# Patient Record
Sex: Male | Born: 1970 | Race: Asian | Hispanic: No | Marital: Married | Smoking: Never smoker
Health system: Southern US, Community
[De-identification: ages and names within clinical notes are randomized; demographics above are authoritative.]

---

## 2016-03-17 ENCOUNTER — Ambulatory Visit (INDEPENDENT_AMBULATORY_CARE_PROVIDER_SITE_OTHER): Payer: BLUE CROSS/BLUE SHIELD

## 2016-03-17 ENCOUNTER — Ambulatory Visit (HOSPITAL_COMMUNITY)
Admission: EM | Admit: 2016-03-17 | Discharge: 2016-03-17 | Disposition: A | Discharge status: Home / Self Care | Payer: BLUE CROSS/BLUE SHIELD | Attending: Family Medicine | Admitting: Family Medicine

## 2016-03-17 ENCOUNTER — Encounter (HOSPITAL_COMMUNITY): Payer: Self-pay | Admitting: Emergency Medicine

## 2016-03-17 DIAGNOSIS — S0512XA Contusion of eyeball and orbital tissues, left eye, initial encounter: Secondary | ICD-10-CM | POA: Diagnosis not present

## 2016-03-17 DIAGNOSIS — S01412A Laceration without foreign body of left cheek and temporomandibular area, initial encounter: Secondary | ICD-10-CM

## 2016-03-17 NOTE — ED Provider Notes (Signed)
CSN: [id number]     Arrival date & time 03/17/16  1533 History   First MD Initiated Contact with Patient 03/17/16 1707     Chief Complaint  Patient presents with  . Head Injury   (Consider location/radiation/quality/duration/timing/severity/associated sxs/prior Treatment) HPI Comments: [age] year old Asian male was playing baseball with his son when his son threw a baseball and struck him in the left eye. This occurred late this morning. He developed left periorbital edema and there is a superficial epidermal laceration across the left infraorbital space. He states there was no loss of consciousness. No time of confusion, disorientation or problems with memory. No problems with vision. No  diplopia or blurring of vision. 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  He states he went to a urgent care on [address] earlier today and was told he may need to have x-rays to be sure that there are no fractures. He was sent to this urgent care.  He is fully awake, alert, smiling, laughing, jovial and stating that he has minor eye discomfort. Denies visual changes at all. States his vision is clear.    History reviewed. No pertinent past medical history. History reviewed. No pertinent past surgical history. No family history on file. Social History  Substance Use Topics  . Smoking status: Never Smoker   . Smokeless tobacco: None  . Alcohol Use: Yes    Review of Systems  Constitutional: Negative for fever, activity change and fatigue.  HENT: Positive for facial swelling. Negative for congestion, dental problem, ear discharge, ear pain, postnasal drip and sore throat.   Eyes: Negative for photophobia, pain, discharge, redness, itching and visual disturbance.  Respiratory: Negative.   Musculoskeletal: Negative.   Skin: Positive for wound.  Neurological: Negative for dizziness, tremors, seizures, syncope, speech difficulty, weakness, light-headedness, numbness and headaches.   Psychiatric/Behavioral: Negative.   All other systems reviewed and are negative.   Allergies  Review of patient's allergies indicates no known allergies.  Home Medications   Prior to Admission medications   Not on File   Meds Ordered and Administered this Visit  Medications - No data to display  BP 124/83 mmHg  Pulse 72  Temp(Src) 97.7 F (36.5 C) (Oral)  Resp 16  SpO2 100% No data found.   Physical Exam  Constitutional: He is oriented to person, place, and time. He appears well-developed and well-nourished. No distress.  HENT:  Head: Normocephalic.  Bilateral TMs are normal. No hemotympanum. Oropharynx is clear. Mandible with full range of motion. No tenderness to the TMJ, maxilla or mandible. There is mild tenderness to the supra and infra orbital bones. No mobility of the bones. There is periorbital swelling and ecchymosis.  Eyes: Conjunctivae and EOM are normal. Pupils are equal, round, and reactive to light. Right eye exhibits no discharge. Left eye exhibits no discharge.  Sclera clear. No erythema or hemorrhage. Anterior chamber clear. No hyphema. Normal pupillary reaction. Full and intact EOM. No evidence of entrapment.  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   Neck: Normal range of motion. Neck supple.  Cardiovascular: Normal rate.   Pulmonary/Chest: Effort normal. No respiratory distress.  Neurological: He is alert and oriented to person, place, and time. He has normal strength. He displays no tremor. No cranial nerve deficit or sensory deficit. He exhibits normal muscle tone. Coordination and gait normal. GCS eye subscore is 4. GCS verbal subscore is 5. GCS  motor subscore is 6.  Skin: Skin is warm and dry.  Psychiatric: He has a normal mood and affect. His behavior is normal. Judgment and thought content normal.  Nursing note and vitals reviewed.   ED Course  .Marland KitchenLaceration  Repair Date/Time: 03/17/2016 5:35 PM Performed by: Phineas Real Ariann Khaimov Authorized by: Bradd Canary D Consent: Verbal consent obtained. Risks and benefits: risks, benefits and alternatives were discussed Consent given by: patient Patient understanding: patient states understanding of the procedure being performed Patient identity confirmed: verbally with patient Body area: head/neck Location details: left cheek Laceration length: 3 cm Foreign bodies: no foreign bodies Tendon involvement: none Nerve involvement: none Vascular damage: no Patient sedated: no Irrigation solution: saline Irrigation method: tap Amount of cleaning: standard Debridement: none Degree of undermining: none Skin closure: glue Approximation: close Approximation difficulty: simple Patient tolerance: Patient tolerated the procedure well with no immediate complications Comments: The laceration did not involve the full thickness of the dermis. Edges well marginated with minimal separation.    (including critical care time)  Labs Review Labs Reviewed - No data to display  Imaging Review Dg Orbits  03/17/2016  CLINICAL DATA:  Hit in left eye with baseball today. EXAM: ORBITS - COMPLETE 4+ VIEW COMPARISON:  None. FINDINGS: There is no evidence of fracture or other significant bone abnormality. No orbital emphysema or sinus air-fluid levels are seen. IMPRESSION: Normal orbits. Electronically Signed   By: Lupita Raider, M.D.   On: 03/17/2016 18:03     Visual Acuity Review  Right Eye Distance: 20/20 (with corrective lens) Left Eye Distance: 20/25 (corrective lens) Bilateral Distance: 20/15 (with corrective lens)  Right Eye Near:   Left Eye Near:    Bilateral Near:         MDM   1. Contusion of orbit, left, initial encounter   2. Laceration of cheek without complication, left, initial encounter    The x-ray is negative. The superficial laceration beneath the eye over the upper cheek was closed with  Dermabond. Instructions for laceration care as well has red flags and symptoms and signs to watch for that would require medical attention promptly are given. For now continue to place ice over the eye for swelling. For any new symptoms, problems or worsening particularly with vision of the eye, headaches, nausea, vomiting, unusual sleepiness, confusion, problems with memory, focal weakness or other problems seeing medical attention promptly. The patient is fully alert and awake showing no signs of distress or head injury. He is discharged home in good condition.     Hayden Rasmussen, NP 03/17/16 [phone]

## 2016-03-17 NOTE — Discharge Instructions (Signed)
Laceration Care, Adult A laceration is a cut that goes through all layers of the skin. The cut also goes into the tissue that is right under the skin. Some cuts heal on their own. Others need to be closed with stitches (sutures), staples, skin adhesive strips, or wound glue. Taking care of your cut lowers your risk of infection and helps your cut to heal better. HOW TO TAKE CARE OF YOUR CUT For stitches or staples:  Keep the wound clean and dry.  If you were given a bandage (dressing), you should change it at least one time per day or as told by your doctor. You should also change it if it gets wet or dirty.  Keep the wound completely dry for the first 24 hours or as told by your doctor. After that time, you may take a shower or a bath. However, make sure that the wound is not soaked in water until after the stitches or staples have been removed.  Clean the wound one time each day or as told by your doctor:  Wash the wound with soap and water.  Rinse the wound with water until all of the soap comes off.  Pat the wound dry with a clean towel. Do not rub the wound.  After you clean the wound, put a thin layer of antibiotic ointment on it as told by your doctor. This ointment:  Helps to prevent infection.  Keeps the bandage from sticking to the wound.  Have your stitches or staples removed as told by your doctor. If your doctor used skin adhesive strips:   Keep the wound clean and dry.  If you were given a bandage, you should change it at least one time per day or as told by your doctor. You should also change it if it gets dirty or wet.  Do not get the skin adhesive strips wet. You can take a shower or a bath, but be careful to keep the wound dry.  If the wound gets wet, pat it dry with a clean towel. Do not rub the wound.  Skin adhesive strips fall off on their own. You can trim the strips as the wound heals. Do not remove any strips that are still stuck to the wound. They will  fall off after a while. If your doctor used wound glue:++++++++++++++  Try to keep your wound dry, but you may briefly wet it in the shower or bath. Do not soak the wound in water, such as by swimming.  After you take a shower or a bath, gently pat the wound dry with a clean towel. Do not rub the wound.  Do not do any activities that will make you really sweaty until the skin glue has fallen off on its own.  Do not apply liquid, cream, or ointment medicine to your wound while the skin glue is still on.  If you were given a bandage, you should change it at least one time per day or as told by your doctor. You should also change it if it gets dirty or wet.  If a bandage is placed over the wound, do not let the tape for the bandage touch the skin glue.  Do not pick at the glue. The skin glue usually stays on for 5-10 days. Then, it falls off of the skin. General Instructions  To help prevent scarring, make sure to cover your wound with sunscreen whenever you are outside after stitches are removed, after adhesive strips are removed,  or when wound glue stays in place and the wound is healed. Make sure to wear a sunscreen of at least 30 SPF.  Take over-the-counter and prescription medicines only as told by your doctor.  If you were given antibiotic medicine or ointment, take or apply it as told by your doctor. Do not stop using the antibiotic even if your wound is getting better.  Do not scratch or pick at the wound.  Keep all follow-up visits as told by your doctor. This is important.  Check your wound every day for signs of infection. Watch for:  Redness, swelling, or pain.  Fluid, blood, or pus.  Raise (elevate) the injured area above the level of your heart while you are sitting or lying down, if possible. GET HELP IF:  You got a tetanus shot and you have any of these problems at the injection site:  Swelling.  Very bad pain.  Redness.  Bleeding.  You have a fever.  A  wound that was closed breaks open.  You notice a bad smell coming from your wound or your bandage.  You notice something coming out of the wound, such as wood or glass.  Medicine does not help your pain.  You have more redness, swelling, or pain at the site of your wound.  You have fluid, blood, or pus coming from your wound.  You notice a change in the color of your skin near your wound.  You need to change the bandage often because fluid, blood, or pus is coming from the wound.  You start to have a new rash.  You start to have numbness around the wound. GET HELP RIGHT AWAY IF:  You have very bad swelling around the wound.  Your pain suddenly gets worse and is very bad.  You notice painful lumps near the wound or on skin that is anywhere on your body.  You have a red streak going away from your wound.  The wound is on your hand or foot and you cannot move a finger or toe like you usually can.  The wound is on your hand or foot and you notice that your fingers or toes look pale or bluish.   This information is not intended to replace advice given to you by your health care provider. Make sure you discuss any questions you have with your health care provider.   Document Released: 05/10/2008 Document Revised: 04/08/2015 Document Reviewed: 11/18/2014 Elsevier Interactive Patient Education 2016 [employer].  Hyphema.   You do not have one but should you develop this need to seek medical treatment. Hyphema is bleeding in the eye. This may occurin the front of the eye between the clear covering of the eye (cornea) and the colored part of the eye (iris). You may be able to see the blood in the front part of your eye. A hyphema may be large or small. Hyphema may be painful and can affect your vision. Treatment is important to prevent permanent loss of vision. CAUSES  Eye injury, such as a blow to your eye or upper part of your face, is the most common cause of this condition. Eye  surgery can also cause this condition. Other less common causes include:  Abnormal blood vessels that form in the iris.  Eye infections.  Blood clotting disorders.  Artificial lenses used after cataract surgery.  Eye cancer. RISK FACTORS This condition is more likely to occur in people who play sports, especially sports that use small balls. You  may also be more likely to develop this condition if you:  Have a disease that prevents normal blood clotting, such as hemophilia.  Take certain medicines that thin your blood, such as aspirin.  Have diabetes.  Had recent eye surgery.  Have sickle cell anemia. SYMPTOMS  The most common symptom of this condition is a pool of blood in the front of your eye. The blood may appear red or black. A very small hyphema may not be visible. A large hyphema may fill part or all of the front part of your eye. Symptoms may also include:   Blurred vision or vision loss.  Pain.  Sensitivity to bright light. DIAGNOSIS  This condition is diagnosed with a medical history and physical exam. You may have a blood test to check for a bleeding disorder or sickle cell disease. You may also have an eye exam done by an eye specialist (ophthalmologist). This may include:   Checking your eye with a type of microscope (slit lamp).  A vision test.  Measuring the pressure in your eye. TREATMENT  Treatment depends on the severity of the condition. Many hyphemas go away on their own. Your health care provider will monitor your hyphema closely until it goes away completely. Treatment may also include:   Restricted activity or bed rest with your head elevated.  Wearing a cover over your eye (eye shield) to protect it from further injury.  Stopping all medicines that can increase bleeding, such as aspirin. Only do this as told by your health care provider.  Eye drops or medicines taken by mouth to control swelling and pressure in your eye. Eye surgery may be  needed to remove the hyphema if other treatments do not help. HOME CARE INSTRUCTIONS   Rest in bed as told by your health care provider. Lie on your back and use extra pillows to keep your head raised.  Take over-the-counter and prescription medicines only as told by your health care provider.  Wear your eye shield as told by your health care provider.  Do not bend forward or lower your head until your health care provider approves.  Do not lift anything that is heavier than 10 lb (4.5 kg) until your health care provider approves.  Keep all follow-up visits as told by your health care provider. This is important. PREVENTION  It is important to always wear eye protection when you are doing any activity that can result in eye injury. SEEK MEDICAL CARE IF:   You develop pain in the affected eye.  Your vision is not improving.  The amount of blood in your eye does not decrease after several days. SEEK IMMEDIATE MEDICAL CARE IF:   Your vision gets worse.  The amount of blood in your eye increases.  You feel nauseous or vomit.   This information is not intended to replace advice given to you by your health care provider. Make sure you discuss any questions you have with your health care provider.   Document Released: 02/28/2001 Document Revised: 08/13/2015 Document Reviewed: 04/16/2015 Elsevier Interactive Patient Education 2016 [employer].  Stitches, [place], or Adhesive Wound Closure Health care providers use stitches (sutures), staples, and certain glue (skin adhesives) to hold skin together while it heals (wound closure). You may need this treatment after you have surgery or if you cut your skin accidentally. These methods help your skin to heal more quickly and make it less likely that you will have a scar. A wound may take several months  to heal completely. The type of wound you have determines when your wound gets closed. In most cases, the wound is closed as soon as  possible (primary skin closure). Sometimes, closure is delayed so the wound can be cleaned and allowed to heal naturally. This reduces the chance of infection. Delayed closure may be needed if your wound:  Is caused by a bite.  Happened more than 6 hours ago.  Involves loss of skin or the tissues under the skin.  Has dirt or debris in it that cannot be removed.  Is infected. WHAT ARE THE DIFFERENT KINDS OF WOUND CLOSURES? There are many options for wound closure. The one that your health care provider uses depends on how deep and how large your wound is. Adhesive Glue To use this type of glue to close a wound, your health care provider holds the edges of the wound together and paints the glue on the surface of your skin. You may need more than one layer of glue. Then the wound may be covered with a light bandage (dressing). This type of skin closure may be used for small wounds that are not deep (superficial). Using glue for wound closure is less painful than other methods. It does not require a medicine that numbs the area (local anesthetic). This method also leaves nothing to be removed. Adhesive glue is often used for children and on facial wounds. Adhesive glue cannot be used for wounds that are deep, uneven, or bleeding. It is not used inside of a wound.  Adhesive Strips These strips are made of sticky (adhesive), porous paper. They are applied across your skin edges like a regular adhesive bandage. You leave them on until they fall off. Adhesive strips may be used to close very superficial wounds. They may also be used along with sutures to improve the closure of your skin edges.  Sutures Sutures are the oldest method of wound closure. Sutures can be made from natural substances, such as silk, or from synthetic materials, such as nylon and steel. They can be made from a material that your body can break down as your wound heals (absorbable), or they can be made from a material that needs  to be removed from your skin (nonabsorbable). They come in many different strengths and sizes. Your health care provider attaches the sutures to a steel needle on one end. Sutures can be passed through your skin, or through the tissues beneath your skin. Then they are tied and cut. Your skin edges may be closed in one continuous stitch or in separate stitches. Sutures are strong and can be used for all kinds of wounds. Absorbable sutures may be used to close tissues under the skin. The disadvantage of sutures is that they may cause skin reactions that lead to infection. Nonabsorbable sutures need to be removed. Staples When surgical staples are used to close a wound, the edges of your skin on both sides of the wound are brought close together. A staple is placed across the wound, and an instrument secures the edges together. Staples are often used to close surgical cuts (incisions). Staples are faster to use than sutures, and they cause less skin reaction. Staples need to be removed using a tool that bends the staples away from your skin. HOW DO I CARE FOR MY WOUND CLOSURE?  Take medicines only as directed by your health care provider.  If you were prescribed an antibiotic medicine for your wound, finish it all even if you start to  feel better.  Use ointments or creams only as directed by your health care provider.  Wash your hands with soap and water before and after touching your wound.  Do not soak your wound in water. Do not take baths, swim, or use a hot tub until your health care provider approves.  Ask your health care provider when you can start showering. Cover your wound if directed by your health care provider.  Do not take out your own sutures or staples.  Do not pick at your wound. Picking can cause an infection.  Keep all follow-up visits as directed by your health care provider. This is important. HOW LONG WILL I HAVE MY WOUND CLOSURE?  Leave adhesive glue on your skin until  the glue peels away.  Leave adhesive strips on your skin until the strips fall off.  Absorbable sutures will dissolve within several days.  Nonabsorbable sutures and staples must be removed. The location of the wound will determine how long they stay in. This can range from several days to a couple of weeks. WHEN SHOULD I SEEK HELP FOR MY WOUND CLOSURE? Contact your health care provider if:  You have a fever.  You have chills.  You have drainage, redness, swelling, or pain at your wound.  There is a bad smell coming from your wound.  The skin edges of your wound start to separate after your sutures have been removed.  Your wound becomes thick, raised, and darker in color after your sutures come out (scarring).   This information is not intended to replace advice given to you by your health care provider. Make sure you discuss any questions you have with your health care provider.   Document Released: 08/17/2001 Document Revised: 12/13/2014 Document Reviewed: 05/01/2014 Elsevier Interactive Patient Education [employer]

## 2016-03-17 NOTE — ED Notes (Signed)
Hit in left eye with a baseball, swelling, bruising present.

## 2017-04-14 IMAGING — DX DG ORBITS COMPLETE 4+V
3 series · 3 of 3 positions shown · non-contrast
Comparison: None.

CLINICAL DATA: Hit in left eye with baseball today.

EXAM:
ORBITS - COMPLETE 4+ VIEW

[orbital axial]
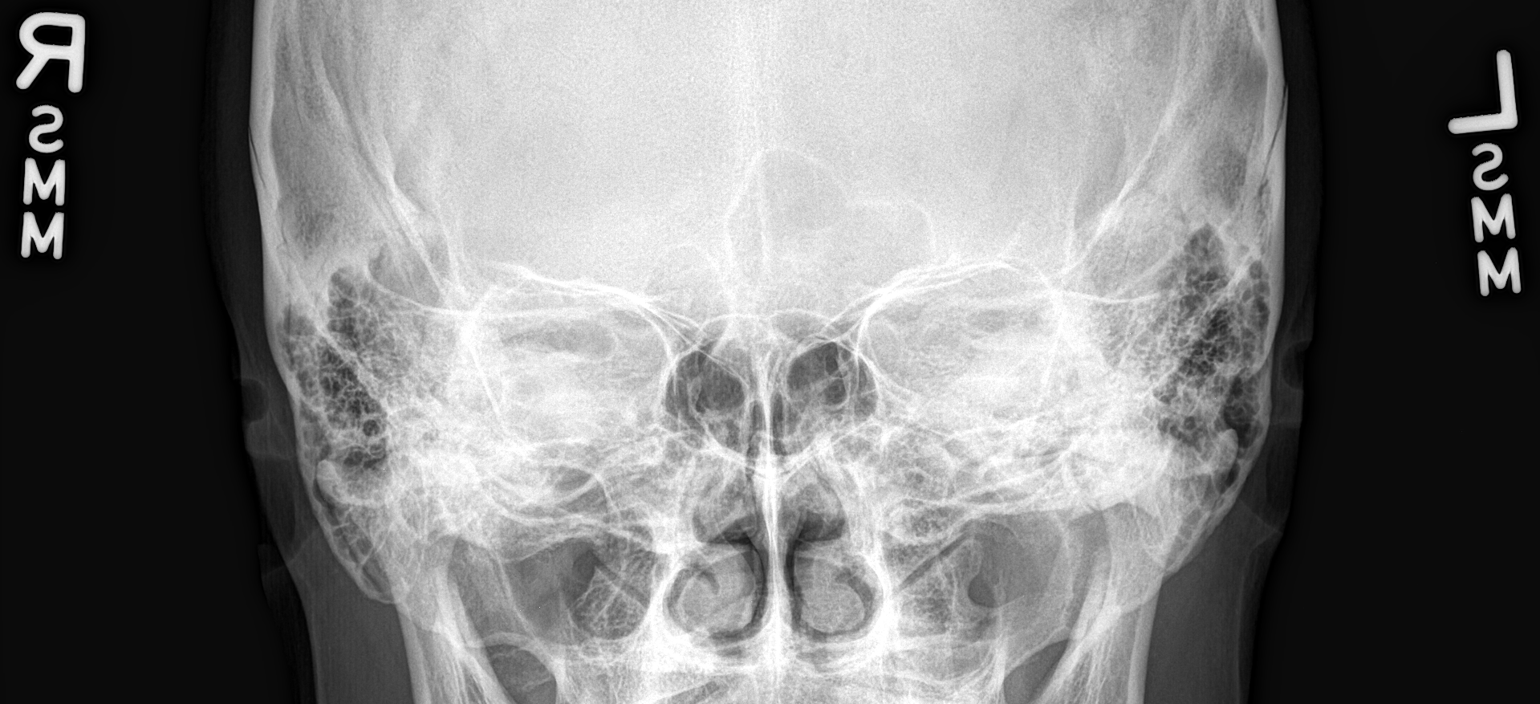

[skull waters]
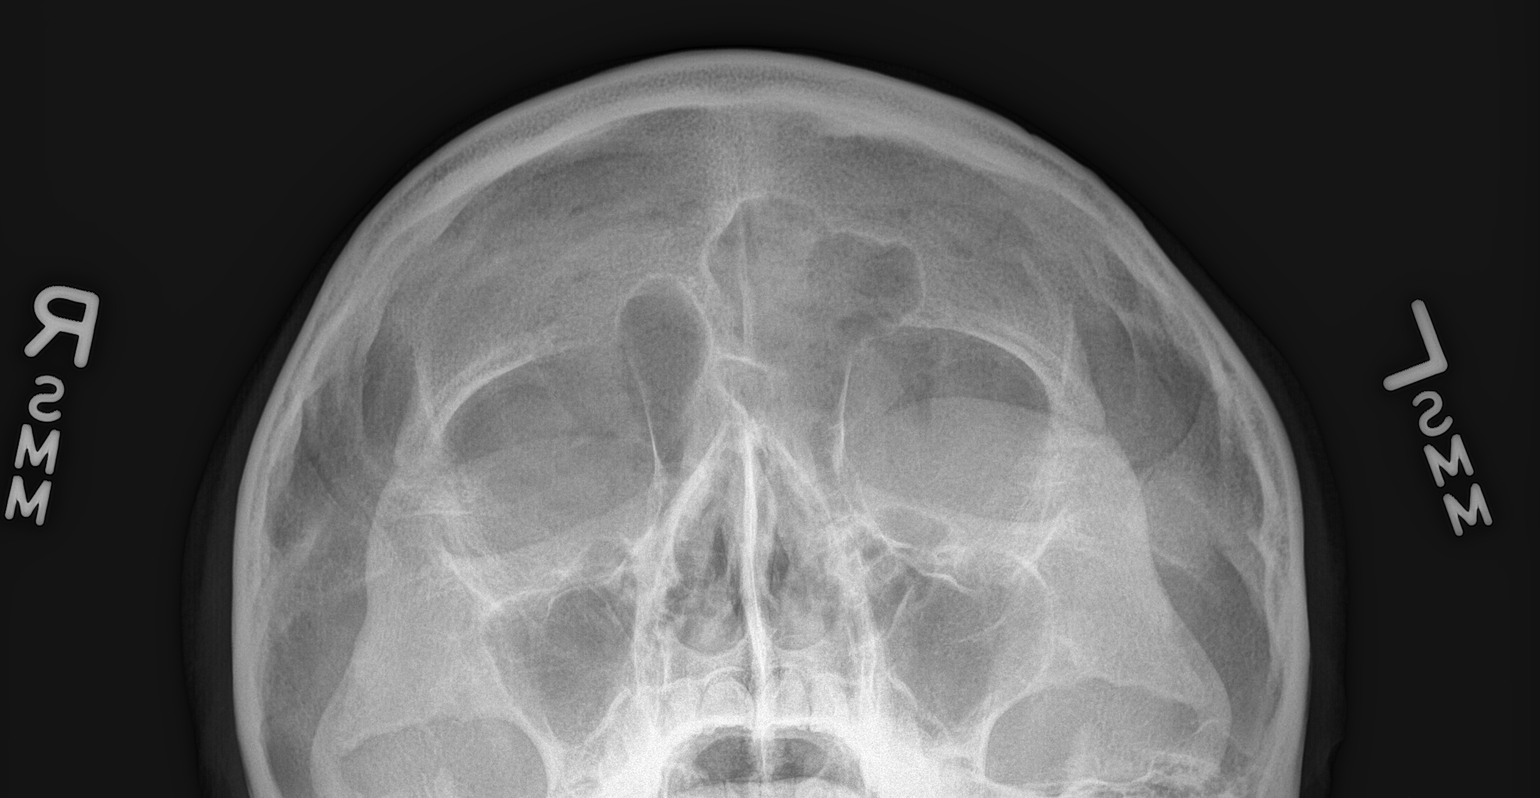

[skull lat]
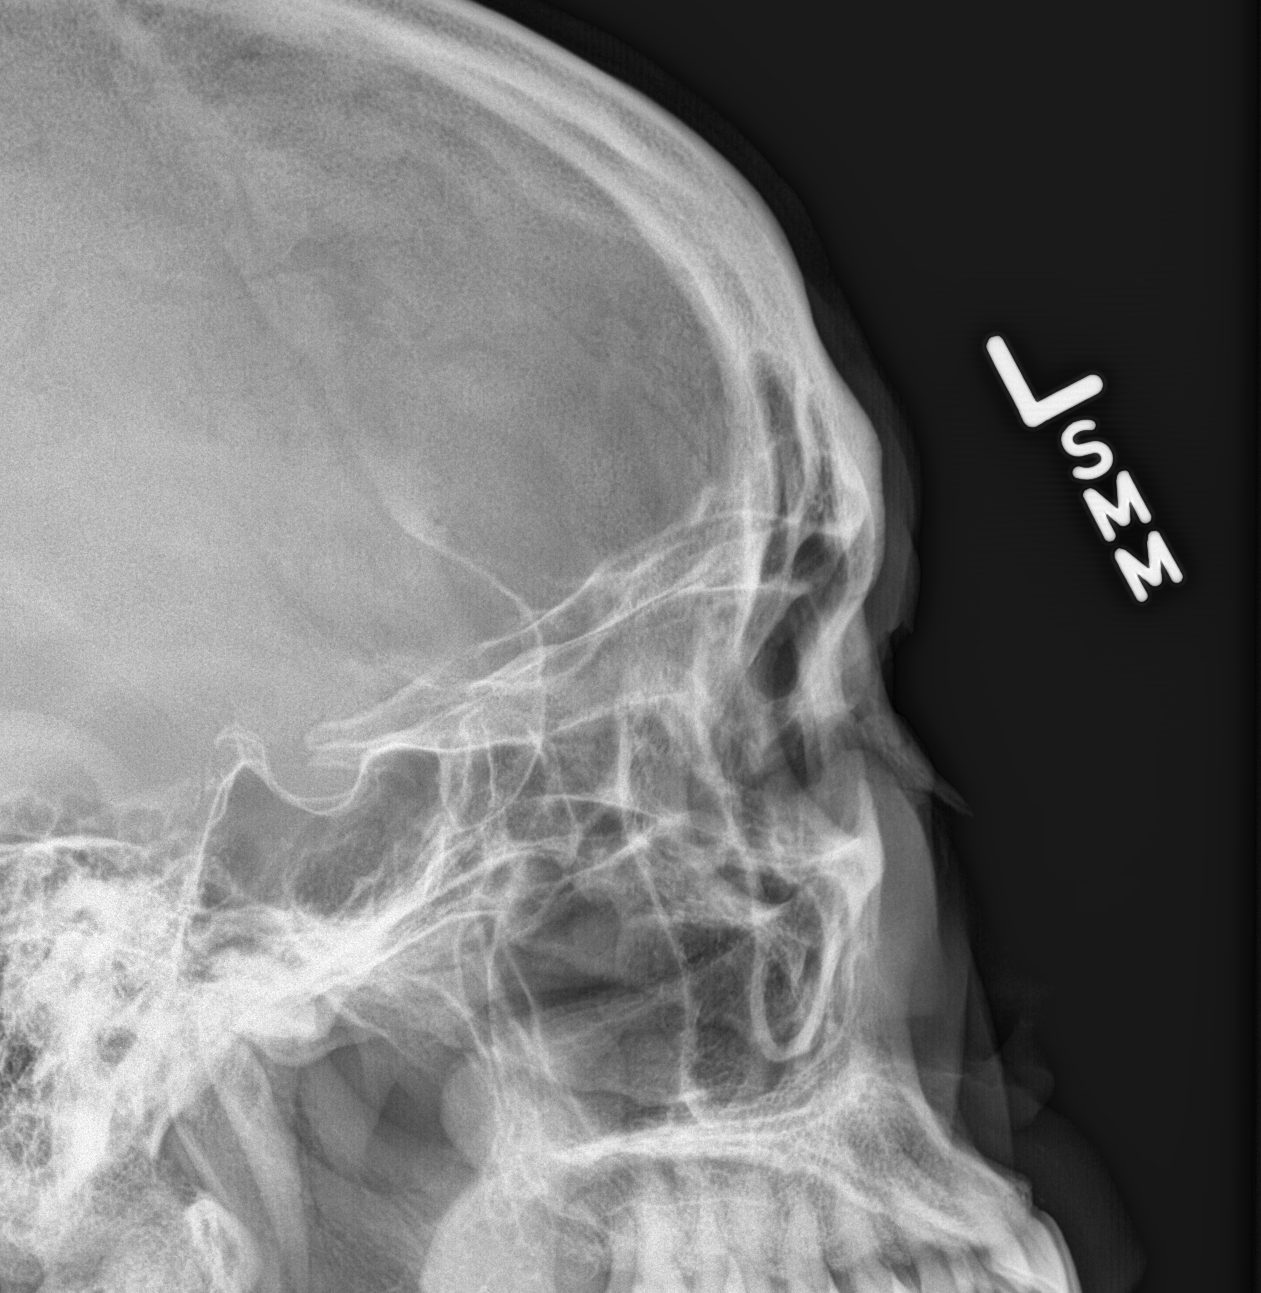

[3 of 3 positions shown; findings below may reference images not displayed]

FINDINGS: There is no evidence of fracture or other significant bone
abnormality. No orbital emphysema or sinus air-fluid levels are
seen.
IMPRESSION: Normal orbits.

## 2018-01-11 DIAGNOSIS — Z0000 Encounter for general adult medical examination without abnormal findings: Secondary | ICD-10-CM | POA: Diagnosis not present

## 2018-01-11 DIAGNOSIS — Z13220 Encounter for screening for lipoid disorders: Secondary | ICD-10-CM | POA: Diagnosis not present

## 2019-02-02 DIAGNOSIS — Z13220 Encounter for screening for lipoid disorders: Secondary | ICD-10-CM | POA: Diagnosis not present

## 2019-02-02 DIAGNOSIS — Z0000 Encounter for general adult medical examination without abnormal findings: Secondary | ICD-10-CM | POA: Diagnosis not present

## 2019-02-02 DIAGNOSIS — Z8249 Family history of ischemic heart disease and other diseases of the circulatory system: Secondary | ICD-10-CM | POA: Diagnosis not present

## 2020-07-20 ENCOUNTER — Emergency Department (HOSPITAL_COMMUNITY): Payer: No Typology Code available for payment source

## 2020-07-20 ENCOUNTER — Encounter (HOSPITAL_COMMUNITY)
Admission: EM | Disposition: A | Discharge status: Home / Self Care | Payer: Self-pay | Source: Home / Self Care | Attending: Orthopedic Surgery

## 2020-07-20 ENCOUNTER — Encounter (HOSPITAL_COMMUNITY): Payer: Self-pay | Admitting: Anesthesiology

## 2020-07-20 ENCOUNTER — Emergency Department (HOSPITAL_COMMUNITY): Payer: No Typology Code available for payment source | Admitting: Certified Registered Nurse Anesthetist

## 2020-07-20 ENCOUNTER — Observation Stay (HOSPITAL_COMMUNITY)
Admission: EM | Admit: 2020-07-20 | Discharge: 2020-07-21 | Disposition: A | Discharge status: Home / Self Care | Payer: No Typology Code available for payment source | Attending: Orthopedic Surgery | Admitting: Orthopedic Surgery

## 2020-07-20 DIAGNOSIS — Y999 Unspecified external cause status: Secondary | ICD-10-CM | POA: Diagnosis not present

## 2020-07-20 DIAGNOSIS — Y929 Unspecified place or not applicable: Secondary | ICD-10-CM | POA: Diagnosis not present

## 2020-07-20 DIAGNOSIS — S82842B Displaced bimalleolar fracture of left lower leg, initial encounter for open fracture type I or II: Secondary | ICD-10-CM | POA: Diagnosis not present

## 2020-07-20 DIAGNOSIS — Y939 Activity, unspecified: Secondary | ICD-10-CM | POA: Insufficient documentation

## 2020-07-20 DIAGNOSIS — S99912A Unspecified injury of left ankle, initial encounter: Secondary | ICD-10-CM | POA: Diagnosis present

## 2020-07-20 DIAGNOSIS — Z23 Encounter for immunization: Secondary | ICD-10-CM | POA: Diagnosis not present

## 2020-07-20 DIAGNOSIS — U071 COVID-19: Secondary | ICD-10-CM | POA: Diagnosis not present

## 2020-07-20 DIAGNOSIS — R52 Pain, unspecified: Secondary | ICD-10-CM

## 2020-07-20 DIAGNOSIS — S9304XA Dislocation of right ankle joint, initial encounter: Secondary | ICD-10-CM

## 2020-07-20 DIAGNOSIS — S82842C Displaced bimalleolar fracture of left lower leg, initial encounter for open fracture type IIIA, IIIB, or IIIC: Secondary | ICD-10-CM | POA: Diagnosis present

## 2020-07-20 DIAGNOSIS — S82899A Other fracture of unspecified lower leg, initial encounter for closed fracture: Secondary | ICD-10-CM | POA: Diagnosis present

## 2020-07-20 DIAGNOSIS — S82891B Other fracture of right lower leg, initial encounter for open fracture type I or II: Secondary | ICD-10-CM

## 2020-07-20 DIAGNOSIS — T148XXA Other injury of unspecified body region, initial encounter: Secondary | ICD-10-CM

## 2020-07-20 HISTORY — PX: ORIF ANKLE FRACTURE: SHX5408

## 2020-07-20 LAB — CBC WITH DIFFERENTIAL/PLATELET
Abs Immature Granulocytes: 0.03 10*3/uL (ref 0.00–0.07)
Basophils Absolute: 0.1 10*3/uL (ref 0.0–0.1)
Basophils Relative: 1 %
Eosinophils Absolute: 0.4 10*3/uL (ref 0.0–0.5)
Eosinophils Relative: 5 %
HCT: 42 % (ref 39.0–52.0)
Hemoglobin: 13.9 g/dL (ref 13.0–17.0)
Immature Granulocytes: 0 %
Lymphocytes Relative: 23 %
Lymphs Abs: 1.6 10*3/uL (ref 0.7–4.0)
MCH: 31 pg (ref 26.0–34.0)
MCHC: 33.1 g/dL (ref 30.0–36.0)
MCV: 93.5 fL (ref 80.0–100.0)
Monocytes Absolute: 0.6 10*3/uL (ref 0.1–1.0)
Monocytes Relative: 8 %
Neutro Abs: 4.4 10*3/uL (ref 1.7–7.7)
Neutrophils Relative %: 63 %
Platelets: 236 10*3/uL (ref 150–400)
RBC: 4.49 MIL/uL (ref 4.22–5.81)
RDW: 12.1 % (ref 11.5–15.5)
WBC: 7.1 10*3/uL (ref 4.0–10.5)
nRBC: 0 % (ref 0.0–0.2)

## 2020-07-20 LAB — BASIC METABOLIC PANEL
Anion gap: 10 (ref 5–15)
BUN: 10 mg/dL (ref 6–20)
CO2: 24 mmol/L (ref 22–32)
Calcium: 9.7 mg/dL (ref 8.9–10.3)
Chloride: 106 mmol/L (ref 98–111)
Creatinine, Ser: 0.92 mg/dL (ref 0.61–1.24)
GFR calc Af Amer: >60 mL/min (ref >60)
GFR calc non Af Amer: >60 mL/min (ref >60)
Glucose, Bld: 140 mg/dL — ABNORMAL HIGH (ref 70–99)
Potassium: 4 mmol/L (ref 3.5–5.1)
Sodium: 140 mmol/L (ref 135–145)

## 2020-07-20 LAB — SARS CORONAVIRUS 2 BY RT PCR (HOSPITAL ORDER, PERFORMED IN ~~LOC~~ HOSPITAL LAB): SARS Coronavirus 2: POSITIVE — AB

## 2020-07-20 SURGERY — OPEN REDUCTION INTERNAL FIXATION (ORIF) ANKLE FRACTURE
Anesthesia: General | Site: Ankle | Laterality: Right

## 2020-07-20 MED ORDER — CEFAZOLIN SODIUM 1 G IJ SOLR
INTRAMUSCULAR | Status: AC
  Filled 2020-07-20: qty 20

## 2020-07-20 MED ORDER — VANCOMYCIN HCL 500 MG IV SOLR
INTRAVENOUS | Status: AC
  Filled 2020-07-20: qty 500

## 2020-07-20 MED ORDER — ETOMIDATE 2 MG/ML IV SOLN
INTRAVENOUS | Status: AC | PRN
  Administered 2020-07-20 (×2): 10 mg via INTRAVENOUS

## 2020-07-20 MED ORDER — BISACODYL 10 MG RE SUPP: 10.0000 mg | Freq: Every day | RECTAL | Status: DC | PRN

## 2020-07-20 MED ORDER — CEFAZOLIN SODIUM-DEXTROSE 2-4 GM/100ML-% IV SOLN
2.0000 g | Freq: Three times a day (TID) | INTRAVENOUS | Status: DC
  Administered 2020-07-20 – 2020-07-21 (×2): 2 g via INTRAVENOUS
  Filled 2020-07-20 (×3): qty 100

## 2020-07-20 MED ORDER — METRONIDAZOLE IN NACL 5-0.79 MG/ML-% IV SOLN
500.0000 mg | Freq: Three times a day (TID) | INTRAVENOUS | Status: DC
  Administered 2020-07-20 – 2020-07-21 (×2): 500 mg via INTRAVENOUS
  Filled 2020-07-20 (×2): qty 100

## 2020-07-20 MED ORDER — PHENYLEPHRINE HCL (PRESSORS) 10 MG/ML IV SOLN
INTRAVENOUS | Status: DC | PRN
  Administered 2020-07-20 (×6): 40 ug via INTRAVENOUS

## 2020-07-20 MED ORDER — FENTANYL CITRATE (PF) 250 MCG/5ML IJ SOLN
INTRAMUSCULAR | Status: DC | PRN
  Administered 2020-07-20: 50 ug via INTRAVENOUS
  Administered 2020-07-20: 25 ug via INTRAVENOUS
  Administered 2020-07-20 (×2): 50 ug via INTRAVENOUS
  Administered 2020-07-20: 25 ug via INTRAVENOUS
  Administered 2020-07-20: 50 ug via INTRAVENOUS

## 2020-07-20 MED ORDER — ONDANSETRON HCL 4 MG/2ML IJ SOLN
INTRAMUSCULAR | Status: DC | PRN
  Administered 2020-07-20: 4 mg via INTRAVENOUS

## 2020-07-20 MED ORDER — CELECOXIB 200 MG PO CAPS
200.0000 mg | ORAL_CAPSULE | Freq: Two times a day (BID) | ORAL | Status: DC
  Administered 2020-07-20 – 2020-07-21 (×2): 200 mg via ORAL
  Filled 2020-07-20 (×2): qty 1

## 2020-07-20 MED ORDER — LIDOCAINE 2% (20 MG/ML) 5 ML SYRINGE
INTRAMUSCULAR | Status: DC | PRN
  Administered 2020-07-20: 40 mg via INTRAVENOUS

## 2020-07-20 MED ORDER — DEXAMETHASONE SODIUM PHOSPHATE 10 MG/ML IJ SOLN
INTRAMUSCULAR | Status: DC | PRN
  Administered 2020-07-20: 5 mg via INTRAVENOUS

## 2020-07-20 MED ORDER — MIDAZOLAM HCL 2 MG/2ML IJ SOLN
INTRAMUSCULAR | Status: DC | PRN
  Administered 2020-07-20 (×2): 1 mg via INTRAVENOUS

## 2020-07-20 MED ORDER — TETANUS-DIPHTH-ACELL PERTUSSIS 5-2.5-18.5 LF-MCG/0.5 IM SUSP
0.5000 mL | Freq: Once | INTRAMUSCULAR | Status: AC
  Administered 2020-07-20: 0.5 mL via INTRAMUSCULAR
  Filled 2020-07-20: qty 0.5

## 2020-07-20 MED ORDER — SODIUM CHLORIDE 0.9 % IV SOLN: INTRAVENOUS | Status: DC

## 2020-07-20 MED ORDER — MORPHINE SULFATE (PF) 2 MG/ML IV SOLN: 0.5000 mg | INTRAVENOUS | Status: DC | PRN

## 2020-07-20 MED ORDER — ONDANSETRON HCL 4 MG/2ML IJ SOLN: 4.0000 mg | Freq: Four times a day (QID) | INTRAMUSCULAR | Status: DC | PRN

## 2020-07-20 MED ORDER — LACTATED RINGERS IV SOLN
INTRAVENOUS | Status: DC | PRN
Start: 2020-07-20 — End: 2020-07-20

## 2020-07-20 MED ORDER — FENTANYL CITRATE (PF) 100 MCG/2ML IJ SOLN
100.0000 ug | Freq: Once | INTRAMUSCULAR | Status: DC
  Filled 2020-07-20 (×2): qty 2

## 2020-07-20 MED ORDER — SUCCINYLCHOLINE CHLORIDE 200 MG/10ML IV SOSY
PREFILLED_SYRINGE | INTRAVENOUS | Status: DC | PRN
  Administered 2020-07-20: 120 mg via INTRAVENOUS

## 2020-07-20 MED ORDER — HYDROCODONE-ACETAMINOPHEN 5-325 MG PO TABS: 1.0000 | ORAL_TABLET | ORAL | Status: DC | PRN

## 2020-07-20 MED ORDER — MIDAZOLAM HCL 2 MG/2ML IJ SOLN
INTRAMUSCULAR | Status: AC
  Filled 2020-07-20: qty 2

## 2020-07-20 MED ORDER — HYDROCODONE-ACETAMINOPHEN 7.5-325 MG PO TABS: 1.0000 | ORAL_TABLET | ORAL | Status: DC | PRN

## 2020-07-20 MED ORDER — VANCOMYCIN HCL 500 MG IV SOLR
INTRAVENOUS | Status: DC | PRN
  Administered 2020-07-20: 500 mg via TOPICAL

## 2020-07-20 MED ORDER — FENTANYL CITRATE (PF) 250 MCG/5ML IJ SOLN
INTRAMUSCULAR | Status: AC
  Filled 2020-07-20: qty 5

## 2020-07-20 MED ORDER — FENTANYL CITRATE (PF) 100 MCG/2ML IJ SOLN
INTRAMUSCULAR | Status: AC | PRN
  Administered 2020-07-20: 100 ug via INTRAVENOUS

## 2020-07-20 MED ORDER — PROPOFOL 10 MG/ML IV BOLUS
INTRAVENOUS | Status: DC | PRN
  Administered 2020-07-20 (×2): 40 mg via INTRAVENOUS
  Administered 2020-07-20: 110 mg via INTRAVENOUS

## 2020-07-20 MED ORDER — MAGNESIUM CITRATE PO SOLN: 1.0000 | Freq: Once | ORAL | Status: DC | PRN

## 2020-07-20 MED ORDER — PROPOFOL 10 MG/ML IV BOLUS
INTRAVENOUS | Status: AC
  Filled 2020-07-20: qty 40

## 2020-07-20 MED ORDER — SENNA 8.6 MG PO TABS
1.0000 | ORAL_TABLET | Freq: Two times a day (BID) | ORAL | Status: DC
  Administered 2020-07-20 – 2020-07-21 (×2): 8.6 mg via ORAL
  Filled 2020-07-20 (×2): qty 1

## 2020-07-20 MED ORDER — DOCUSATE SODIUM 100 MG PO CAPS
100.0000 mg | ORAL_CAPSULE | Freq: Two times a day (BID) | ORAL | Status: DC
  Administered 2020-07-20 – 2020-07-21 (×2): 100 mg via ORAL
  Filled 2020-07-20 (×2): qty 1

## 2020-07-20 MED ORDER — ETOMIDATE 2 MG/ML IV SOLN
10.0000 mg | Freq: Once | INTRAVENOUS | Status: DC
  Filled 2020-07-20: qty 10

## 2020-07-20 MED ORDER — ONDANSETRON HCL 4 MG PO TABS: 4.0000 mg | ORAL_TABLET | Freq: Four times a day (QID) | ORAL | Status: DC | PRN

## 2020-07-20 MED ORDER — CEFAZOLIN SODIUM-DEXTROSE 2-3 GM-%(50ML) IV SOLR
INTRAVENOUS | Status: DC | PRN
  Administered 2020-07-20: 2 g via INTRAVENOUS

## 2020-07-20 MED ORDER — SODIUM CHLORIDE 0.9 % IR SOLN
Status: DC | PRN
  Administered 2020-07-20: 3000 mL

## 2020-07-20 MED ORDER — CEFAZOLIN SODIUM-DEXTROSE 2-4 GM/100ML-% IV SOLN
2.0000 g | Freq: Once | INTRAVENOUS | Status: AC
  Administered 2020-07-20: 2 g via INTRAVENOUS
  Filled 2020-07-20: qty 100

## 2020-07-20 MED ORDER — ENOXAPARIN SODIUM 40 MG/0.4ML ~~LOC~~ SOLN
40.0000 mg | SUBCUTANEOUS | Status: DC
  Administered 2020-07-21: 40 mg via SUBCUTANEOUS
  Filled 2020-07-20: qty 0.4

## 2020-07-20 MED ORDER — BUPIVACAINE-EPINEPHRINE (PF) 0.5% -1:200000 IJ SOLN
INTRAMUSCULAR | Status: DC | PRN
  Administered 2020-07-20: 15 mL
  Administered 2020-07-20: 30 mL

## 2020-07-20 MED ORDER — POLYETHYLENE GLYCOL 3350 17 G PO PACK: 17.0000 g | PACK | Freq: Every day | ORAL | Status: DC | PRN

## 2020-07-20 MED ORDER — 0.9 % SODIUM CHLORIDE (POUR BTL) OPTIME
TOPICAL | Status: DC | PRN
  Administered 2020-07-20: 1000 mL

## 2020-07-20 SURGICAL SUPPLY — 61 items
ALCOHOL 70% 16 OZ (MISCELLANEOUS) ×3 IMPLANT
BANDAGE ESMARK 6X9 LF (GAUZE/BANDAGES/DRESSINGS) ×1 IMPLANT
BIT DRILL 2.5X2.75 QC CALB (BIT) ×3 IMPLANT
BIT DRILL 3.5X5.5 QC CALB (BIT) ×3 IMPLANT
BLADE SURG 15 STRL LF DISP TIS (BLADE) ×1 IMPLANT
BLADE SURG 15 STRL SS (BLADE) ×2
BNDG COHESIVE 4X5 TAN STRL (GAUZE/BANDAGES/DRESSINGS) ×3 IMPLANT
BNDG COHESIVE 6X5 TAN STRL LF (GAUZE/BANDAGES/DRESSINGS) ×3 IMPLANT
BNDG ESMARK 6X9 LF (GAUZE/BANDAGES/DRESSINGS) ×3
CANISTER SUCT 3000ML PPV (MISCELLANEOUS) ×3 IMPLANT
CHLORAPREP W/TINT 26 (MISCELLANEOUS) ×6 IMPLANT
COVER SURGICAL LIGHT HANDLE (MISCELLANEOUS) ×3 IMPLANT
CUFF TOURN SGL QUICK 34 (TOURNIQUET CUFF) ×2
CUFF TOURN SGL QUICK 42 (TOURNIQUET CUFF) IMPLANT
CUFF TRNQT CYL 34X4.125X (TOURNIQUET CUFF) ×1 IMPLANT
DRAPE OEC MINIVIEW 54X84 (DRAPES) ×3 IMPLANT
DRAPE U-SHAPE 47X51 STRL (DRAPES) ×3 IMPLANT
DRSG MEPITEL 4X7.2 (GAUZE/BANDAGES/DRESSINGS) ×3 IMPLANT
DRSG PAD ABDOMINAL 8X10 ST (GAUZE/BANDAGES/DRESSINGS) ×6 IMPLANT
ELECT REM PT RETURN 9FT ADLT (ELECTROSURGICAL) ×3
ELECTRODE REM PT RTRN 9FT ADLT (ELECTROSURGICAL) ×1 IMPLANT
FIXATION ZIPTIGHT ANKLE SNDSMS (Ankle) ×1 IMPLANT
GAUZE SPONGE 4X4 12PLY STRL (GAUZE/BANDAGES/DRESSINGS) IMPLANT
GLOVE BIO SURGEON STRL SZ8 (GLOVE) ×3 IMPLANT
GLOVE BIOGEL PI IND STRL 8 (GLOVE) ×1 IMPLANT
GLOVE BIOGEL PI INDICATOR 8 (GLOVE) ×2
GLOVE ECLIPSE 8.0 STRL XLNG CF (GLOVE) ×3 IMPLANT
GOWN STRL REUS W/ TWL LRG LVL3 (GOWN DISPOSABLE) ×1 IMPLANT
GOWN STRL REUS W/ TWL XL LVL3 (GOWN DISPOSABLE) ×2 IMPLANT
GOWN STRL REUS W/TWL LRG LVL3 (GOWN DISPOSABLE) ×2
GOWN STRL REUS W/TWL XL LVL3 (GOWN DISPOSABLE) ×4
IV NS IRRIG 3000ML ARTHROMATIC (IV SOLUTION) ×6 IMPLANT
KIT BASIN OR (CUSTOM PROCEDURE TRAY) ×3 IMPLANT
KIT TURNOVER KIT B (KITS) ×3 IMPLANT
NS IRRIG 1000ML POUR BTL (IV SOLUTION) ×3 IMPLANT
PACK ORTHO EXTREMITY (CUSTOM PROCEDURE TRAY) ×3 IMPLANT
PAD ARMBOARD 7.5X6 YLW CONV (MISCELLANEOUS) ×6 IMPLANT
PAD CAST 4YDX4 CTTN HI CHSV (CAST SUPPLIES) ×1 IMPLANT
PADDING CAST COTTON 4X4 STRL (CAST SUPPLIES) ×2
PLATE ACE 100DEG 7HOLE (Plate) ×3 IMPLANT
SCREW CORTICAL 3.5MM  16MM (Screw) ×4 IMPLANT
SCREW CORTICAL 3.5MM  20MM (Screw) ×4 IMPLANT
SCREW CORTICAL 3.5MM 14MM (Screw) ×6 IMPLANT
SCREW CORTICAL 3.5MM 16MM (Screw) ×2 IMPLANT
SCREW CORTICAL 3.5MM 20MM (Screw) ×2 IMPLANT
SCREW CORTICAL 3.5MM 24MM (Screw) ×3 IMPLANT
SPLINT PLASTER CAST XFAST 5X30 (CAST SUPPLIES) ×20 IMPLANT
SPLINT PLASTER XFAST SET 5X30 (CAST SUPPLIES) ×40
SPONGE LAP 18X18 RF (DISPOSABLE) ×3 IMPLANT
SUCTION FRAZIER HANDLE 10FR (MISCELLANEOUS) ×2
SUCTION TUBE FRAZIER 10FR DISP (MISCELLANEOUS) ×1 IMPLANT
SUT ETHILON 3 0 PS 1 (SUTURE) ×6 IMPLANT
SUT MNCRL AB 3-0 PS2 18 (SUTURE) IMPLANT
SUT PDS AB 0 CT1 27 (SUTURE) ×6 IMPLANT
SUT VIC AB 2-0 CT1 27 (SUTURE) ×4
SUT VIC AB 2-0 CT1 TAPERPNT 27 (SUTURE) ×2 IMPLANT
TOWEL GREEN STERILE (TOWEL DISPOSABLE) ×3 IMPLANT
TOWEL GREEN STERILE FF (TOWEL DISPOSABLE) ×3 IMPLANT
TUBE CONNECTING 12'X1/4 (SUCTIONS) ×1
TUBE CONNECTING 12X1/4 (SUCTIONS) ×2 IMPLANT
ZIPTIGHT ANKLE SYNODESMOSS FIX (Ankle) ×3 IMPLANT

## 2020-07-20 NOTE — ED Provider Notes (Signed)
Medical screening examination/treatment/procedure(s) were conducted as a shared visit with non-physician practitioner(s) and myself.  I personally evaluated the patient during the encounter.      Patient seen by me along with our physician assistant.  Patient was riding a electric motorized skateboard fell injuring his right ankle.  With an open fracture on the medial side.  Obvious dislocation.  X-rays show that he is got distal fibula fracture the medial malleolus is intact.  There is a dislocation.  Patient's past medical history noncontributory but his Covid test is positive patient had been vaccinated and he is completely asymptomatic.  Labs without significant abnormalities.  I have contacted the orthopedic tech we will go ahead and plan to reduce this is already been given Ancef for the open fracture and then will place a posterior knee and stirrup type splint on.  Dr. Victorino Dike from orthopedics has been contacted.  Planning to take him to the OR.  We will add on chest x-ray if not already ordered.   CRITICAL CARE Performed by: Vanetta Mulders Total critical care time: 35 minutes Critical care time was exclusive of separately billable procedures and treating other patients. Critical care was necessary to treat or prevent imminent or life-threatening deterioration. Critical care was time spent personally by me on the following activities: development of treatment plan with patient and/or surrogate as well as nursing, discussions with consultants, evaluation of patient's response to treatment, examination of patient, obtaining history from patient or surrogate, ordering and performing treatments and interventions, ordering and review of laboratory studies, ordering and review of radiographic studies, pulse oximetry and re-evaluation of patient's condition.   Delays in taking him to the operating room.  Plus his Covid test was positive despite having the second vaccination over a month ago.  Patient  completely asymptomatic.  We got the [employer] went ahead did conscious sedation with etomidate.  And then physician assistant reduced it and well we put a posterior splint and stirrup splint on it with pretty good reduction on the post reduction films.  Procedure note for the reduction done by physician assistant.  Procedure note for the conscious sedation will be included in this note here.   .Sedation  Date/Time: 07/20/2020 3:06 PM Performed by: Vanetta Mulders, MD Authorized by: Vanetta Mulders, MD   Consent:    Consent obtained:  Written   Consent given by:  Patient   Risks discussed:  Allergic reaction, prolonged hypoxia resulting in organ damage, prolonged sedation necessitating reversal, dysrhythmia, inadequate sedation, respiratory compromise necessitating ventilatory assistance and intubation, vomiting and nausea   Alternatives discussed:  Analgesia without sedation Universal protocol:    Procedure explained and questions answered to patient or proxy's satisfaction: yes     Relevant documents present and verified: yes     Imaging studies available: yes     Required blood products, implants, devices, and special equipment available: yes     Site/side marked: no     Immediately prior to procedure a time out was called: yes   Indications:    Procedure performed:  Dislocation reduction Pre-sedation assessment:    Time since last food or drink:  Patient had water with lemon juice at around 10 AM   ASA classification: class 1 - normal, healthy patient     Neck mobility: normal     Mallampati score:  I - soft palate, uvula, fauces, pillars visible   Pre-sedation assessments completed and reviewed: airway patency, cardiovascular function, mental status, nausea/vomiting, pain level, respiratory function and temperature  Immediate pre-procedure details:    Reassessment: Patient reassessed immediately prior to procedure     Reviewed: vital signs     Verified: bag valve mask  available, emergency equipment available, intubation equipment available, IV patency confirmed, oxygen available and suction available   Procedure details (see MAR for exact dosages):    Preoxygenation:  Nasal cannula   Sedation:  Etomidate   Intended level of sedation: moderate (conscious sedation)   Intra-procedure monitoring:  Blood pressure monitoring, continuous pulse oximetry, continuous capnometry, cardiac monitor, frequent LOC assessments and frequent vital sign checks   Intra-procedure events: none     Total Provider sedation time (minutes):  30 Post-procedure details:    Attendance: Constant attendance by certified staff until patient recovered     Post-sedation assessments completed and reviewed: airway patency, cardiovascular function, mental status, pain level and respiratory function     Patient is stable for discharge or admission: yes     Patient tolerance:  Tolerated well, no immediate complications     Vanetta Mulders, MD 07/20/20 [phone]

## 2020-07-20 NOTE — Anesthesia Postprocedure Evaluation (Signed)
Anesthesia Post Note  Patient: Claris Guymon  Procedure(s) Performed: OPEN REDUCTION INTERNAL FIXATION (ORIF) ANKLE FRACTURE (Right Ankle)     Patient location during evaluation: PACU Anesthesia Type: General and Regional Level of consciousness: awake and alert Pain management: pain level controlled Vital Signs Assessment: post-procedure vital signs reviewed and stable Respiratory status: spontaneous breathing, nonlabored ventilation, respiratory function stable and patient connected to nasal cannula oxygen Cardiovascular status: blood pressure returned to baseline and stable Postop Assessment: no apparent nausea or vomiting Anesthetic complications: no   No complications documented.  Last Vitals:  Vitals:   07/20/20 1930 07/20/20 2002  BP:  128/84  Pulse:  87  Resp:  18  Temp: 36.7 C 36.8 C  SpO2:  98%    Last Pain:  Vitals:   07/20/20 2002  TempSrc: Oral  PainSc:                  Shelton Silvas

## 2020-07-20 NOTE — ED Notes (Signed)
OR called, pt to be taken to short stay bay 36

## 2020-07-20 NOTE — Transfer of Care (Signed)
Immediate Anesthesia Transfer of Care Note  Patient: Jason Hardy  Procedure(s) Performed: OPEN REDUCTION INTERNAL FIXATION (ORIF) ANKLE FRACTURE (Right Ankle)  Patient Location: PACU  Anesthesia Type:General  Level of Consciousness: awake, alert  and oriented  Airway & Oxygen Therapy: Patient Spontanous Breathing  Post-op Assessment: Report given to RN and Post -op Vital signs reviewed and stable  Post vital signs: Reviewed and stable  Last Vitals:  Vitals Value Taken Time  BP 136/80 07/20/20 1807  Temp    Pulse 100 07/20/20 1815  Resp 17 07/20/20 1815  SpO2 95 % 07/20/20 1815  Vitals shown include unvalidated device data.  Last Pain:  Vitals:   07/20/20 1810  TempSrc:   PainSc: (P) 0-No pain         Complications: No complications documented.

## 2020-07-20 NOTE — Anesthesia Procedure Notes (Signed)
Procedure Name: Intubation Date/Time: 07/20/2020 4:16 PM Performed by: Edmonia Caprio, CRNA Pre-anesthesia Checklist: Patient identified, Emergency Drugs available, Suction available and Patient being monitored Patient Re-evaluated:Patient Re-evaluated prior to induction Oxygen Delivery Method: Circle system utilized Preoxygenation: Pre-oxygenation with 100% oxygen Induction Type: IV induction Ventilation: Mask ventilation without difficulty Laryngoscope Size: Glidescope and 3 Grade View: Grade I Tube type: Oral Tube size: 7.5 mm Number of attempts: 1 Airway Equipment and Method: Stylet and Oral airway Placement Confirmation: ETT inserted through vocal cords under direct vision,  positive ETCO2 and breath sounds checked- equal and bilateral Secured at: 23 cm Tube secured with: Tape Dental Injury: Teeth and Oropharynx as per pre-operative assessment

## 2020-07-20 NOTE — Progress Notes (Signed)
Orthopedic Tech Progress Note Patient Details:  Jason Hardy 21-Nov-1971 [id number]  Ortho Devices Type of Ortho Device: Stirrup splint, Short leg splint Ortho Device/Splint Location: LRE Ortho Device/Splint Interventions: Application, Ordered   Post Interventions Patient Tolerated: Well   [name] [name] 07/20/2020, 3:25 PM

## 2020-07-20 NOTE — ED Notes (Signed)
OR ready for patient.  Gave report to anesthesia.

## 2020-07-20 NOTE — ED Notes (Signed)
Patient belongings given to security.  Envelope # [phone] inside # [phone] in locker # 5. Patient copy tubed to OR at tube station 75.

## 2020-07-20 NOTE — Op Note (Signed)
07/20/2020  5:59 PM  PATIENT:  Jason Hardy  49 y.o. male  PRE-OPERATIVE DIAGNOSIS: 1.  Open right ankle fracture dislocation      2.  Right ankle deltoid ligament rupture  POST-OPERATIVE DIAGNOSIS: 1.  Open right ankle dislocation (Gustilo IIIA - contamination)      2.  Open right ankle bimalleolar fracture (lateral and posterior)      3.  Right ankle syndesmosis dispruption      4.  Right ankle deltoid ligament disruption      5.  Medial ankle laceration 7 cm  Procedure(s): 1.  Irrigation and excisional debridement of open right ankle fracture including skin, subcutaneous tissue, muscle and bone 2.  Open reduction of right ankle dislocation 3.  Open treatment of right bimalleolar ankle fracture with internal fixation 4.  Stress examination of right ankle under fluoro 5.  Open treatment of right ankle syndesmosis disruption with internal fixation 6.  Repair of deltoid ligament 7.  Intermediate repair of right ankle laceration 7 cm 8.  AP, mortise and lateral xrays of the right ankle    SURGEON:  Toni Arthurs, MD  ASSISTANT: none  ANESTHESIA:   General, regional  EBL:  minimal   TOURNIQUET:   Total Tourniquet Time Documented: Thigh (Right) - 64 minutes Total: Thigh (Right) - 64 minutes  COMPLICATIONS:  None apparent  DISPOSITION:  Extubated, awake and stable to recovery.   INDICATION FOR PROCEDURE: The patient is a 49 year old male without significant past medical history.  This morning he was riding his son's motorized skateboard when he fell injuring his right ankle.  He immediately noted a laceration with bone protruding from the skin.  He realigned the ankle and presented to the emergency room.  X-rays there revealed a displaced right ankle fracture dislocation.  He underwent provisional reduction in the emergency room. His Covid test in the emergency room is positive despite 2 vaccine doses over 2 months ago. He presents now for operative treatment of the open right ankle  fracture dislocation observing all pertinent Covid protocols.   The risks and benefits of the alternative treatment options have been discussed in detail.  The patient wishes to proceed with surgery and specifically understands risks of bleeding, infection, nerve damage, blood clots, need for additional surgery, amputation and death.  PROCEDURE IN DETAIL:  After pre operative consent was obtained, and the correct operative site was identified, the patient was brought to the operating room and placed supine on the OR table.  Anesthesia was administered.  Pre-operative antibiotics were administered.  A surgical timeout was taken.  The right lower extremity was prepped and draped in standard sterile fashion with a tourniquet around the thigh.  The extremity was elevated and the tourniquet was inflated to 300 mmHg.  The medial laceration was carefully inspected.  It was extended proximally and distally to allow adequate visualization of all anatomic structures.  The ankle was noted to be grossly dislocated.  The medial malleolus was protruding from the medial laceration.  The ankle was reduced pulling the skin and ruptured superficial and deep deltoid ligament fibers out from the joint.  The medial talar dome was noted to have some scuffing of the cartilage but no exposed bone.  There was a fracture noted at the posterior malleolus as well.  Excisional debridement was then performed with scissors and a scalpel.  The skin was carefully inspected circumferentially and debrided of all nonviable tissue.  The subcutaneous tissues were similarly circumferentially inspected and debrided with  scissors.  The posterior tibial tendon was noted to be intact.  The muscle was debrided with a small rondure.  The medial malleolus was ground down at the tip with some contamination by dirt and asphalt.  This was debrided with a curette.  The wound was then copiously irrigated with 3 L of normal saline using pulse lavage.  The wound was  then again carefully inspected with no evidence of persistent contamination.  Attention was turned to the lateral malleolus.  A longitudinal incision was made.  Dissection was carried down through the subcutaneous tissues.  Fracture site was identified.  It was opened and cleaned of all hematoma.  The fracture was reduced and held with a tenaculum.  A 3.5 mm fully threaded lag screw was inserted from anterior to posterior.  It was noted to compress the fracture site appropriately.  A 7 hole one third tubular plate from the [employer] titanium small frag set was contoured to fit the lateral malleolus.  It was secured proximally with 3 bicortical screws and distally with 3 unicortical screws.  AP, lateral and mortise radiographs confirmed appropriate reduction of the fibula fracture in appropriate position and length of the hardware.  A stress examination was then performed.  Dorsiflexion and external rotation stress was applied to the supinated forefoot.  There was gross instability noted at the syndesmosis.  The syndesmosis was then reduced.  A drill for the zip tight was inserted through the central hole in the plate.  This was passed through all 4 cortices of the distal fibula and tibia.  The zip tight device was then passed through and tightened securely.  Suture was trimmed.  Radiographs confirmed appropriate reduction of the syndesmosis.  Attention was then returned to the medial side of the ankle.  0 PDS figure-of-eight sutures were used to repair the superficial deltoid.  The medial and lateral ankle wounds were again irrigated copiously.  Vancomycin powder was sprinkled in both wounds.  Both wounds were then closed with horizontal mattress sutures of 3-0 nylon.  Final AP, mortise and lateral radiographs confirmed appropriate reduction of the ankle joint and the lateral malleolus fracture as well as the syndesmosis.  Hardware is appropriately positioned and of the appropriate lengths.  Sterile  dressings were applied followed by well-padded short leg splint.  The tourniquet was released after application of the dressings.  The patient was awakened from anesthesia and transported to the recovery room in stable condition.   FOLLOW UP PLAN: Nonweightbearing on the right lower extremity.  The patient will be admitted to an isolation room observing all Covid protocols.  He will receive 24 hours of IV antibiotics due to his history of contaminated open fracture.  Physical therapy for nonweightbearing gait training.  Lovenox for DVT prophylaxis while an inpatient converting to aspirin upon discharge.   RADIOGRAPHS: AP, lateral and mortise radiographs of the right ankle are obtained intraoperatively.  These show interval reduction of the ankle dislocation as well as the lateral and posterior malleolus fractures and the syndesmosis disruption.  Hardware is appropriately positioned and of the appropriate lengths.  No other acute injuries are evident.

## 2020-07-20 NOTE — Anesthesia Procedure Notes (Signed)
Anesthesia Regional Block: Adductor canal block   Pre-Anesthetic Checklist: ,, timeout performed, Correct Patient, Correct Site, Correct Laterality, Correct Procedure, Correct Position, site marked, Risks and benefits discussed,  Surgical consent,  Pre-op evaluation,  At surgeon's request and post-op pain management  Laterality: Right  Prep: chloraprep       Needles:  Injection technique: Single-shot  Needle Type: Echogenic Stimulator Needle     Needle Length: 9cm  Needle Gauge: 21     Additional Needles:   Procedures:,,,, ultrasound used (permanent image in chart),,,,  Narrative:  Start time: 07/20/2020 4:05 PM End time: 07/20/2020 4:10 PM Injection made incrementally with aspirations every 5 mL.  Performed by: Personally  Anesthesiologist: Shelton Silvas, MD  Additional Notes: Patient tolerated the procedure well. Local anesthetic introduced in an incremental fashion under minimal resistance after negative aspirations. No paresthesias were elicited. After completion of the procedure, no acute issues were identified and patient continued to be monitored by RN.

## 2020-07-20 NOTE — ED Triage Notes (Signed)
Injury to right ankle while skateboarding.   Open fracture to right ankle.

## 2020-07-20 NOTE — Anesthesia Preprocedure Evaluation (Addendum)
Anesthesia Evaluation  Patient identified by MRN, date of birth, ID band Patient awake    Reviewed: Allergy & Precautions, NPO status , Patient's Chart, lab work & pertinent test results  Airway Mallampati: I  TM Distance: >3 FB Neck ROM: Full    Dental  (+) Teeth Intact, Dental Advisory Given   Pulmonary    Pulmonary exam normal        Cardiovascular negative cardio ROS Normal cardiovascular exam     Neuro/Psych    GI/Hepatic Neg liver ROS,   Endo/Other    Renal/GU      Musculoskeletal   Abdominal Normal abdominal exam  (+)   Peds  Hematology negative hematology ROS (+)   Anesthesia Other Findings   Reproductive/Obstetrics                           Anesthesia Physical Anesthesia Plan  ASA: I  Anesthesia Plan: General   Post-op Pain Management: GA combined w/ Regional for post-op pain   Induction: Intravenous, Rapid sequence and Cricoid pressure planned  PONV Risk Score and Plan: 3 and Ondansetron, Dexamethasone and Midazolam  Airway Management Planned: Oral ETT and Video Laryngoscope Planned  Additional Equipment: None  Intra-op Plan:   Post-operative Plan: Extubation in OR  Informed Consent: I have reviewed the patients History and Physical, chart, labs and discussed the procedure including the risks, benefits and alternatives for the proposed anesthesia with the patient or authorized representative who has indicated his/her understanding and acceptance.     Dental advisory given  Plan Discussed with: CRNA  Anesthesia Plan Comments: (Covid +)      Anesthesia Quick Evaluation

## 2020-07-20 NOTE — ED Notes (Signed)
ORtho tech paged  

## 2020-07-20 NOTE — ED Provider Notes (Signed)
[facility] [place] EMERGENCY DEPARTMENT Provider Note   CSN: [id number] Arrival date & time: 07/20/20  1115     History Chief Complaint  Patient presents with  . Foot Injury    Daimon Kean is a 49 y.o. male who presents with an open ankle fracture. He states he was riding his son's electric skateboard around 11AM and it was going too fast and he fell off and fractured his R ankle. He states the bone was sticking out and he pushed it back in. He denies significant pain lying still. He denies numbness or tingling. He has been NPO since 10AM this morning - had water, lemon juice, and "liquid food". Last tetanus was 2012. Has had both COVID vaccines. He is not on blood thinners.  HPI     No past medical history on file.  There are no problems to display for this patient.   No past surgical history on file.     No family history on file.  Social History   Tobacco Use  . Smoking status: Never Smoker  Substance Use Topics  . Alcohol use: Yes  . Drug use: No    Home Medications Prior to Admission medications   Not on File    Allergies    Patient has no known allergies.  Review of Systems   Review of Systems  Musculoskeletal: Positive for arthralgias.  Skin: Positive for wound.  Neurological: Negative for weakness and numbness.  All other systems reviewed and are negative.   Physical Exam Updated Vital Signs BP 121/89 (BP Location: Right Arm)   Pulse 98   Temp 97.8 F (36.6 C) (Oral)   Resp 16   SpO2 100%   Physical Exam Vitals and nursing note reviewed.  Constitutional:      General: He is not in acute distress.    Appearance: Normal appearance. He is well-developed. He is not ill-appearing.     Comments: Awake, alert, calm and cooperative.  HENT:     Head: Normocephalic and atraumatic.  Eyes:     General: No scleral icterus.       Right eye: No discharge.        Left eye: No discharge.     Conjunctiva/sclera: Conjunctivae normal.      Pupils: Pupils are equal, round, and reactive to light.  Cardiovascular:     Rate and Rhythm: Normal rate.  Pulmonary:     Effort: Pulmonary effort is normal. No respiratory distress.  Abdominal:     General: There is no distension.  Musculoskeletal:     Cervical back: Normal range of motion.     Comments: Right ankle: Open fracture of the tibia over the lateral aspect of the ankle. Cap refill <2. N/V intact.   Skin:    General: Skin is warm and dry.  Neurological:     Mental Status: He is alert and oriented to person, place, and time.  Psychiatric:        Behavior: Behavior normal.       ED Results / Procedures / Treatments   Labs (all labs ordered are listed, but only abnormal results are displayed) Labs Reviewed  SARS CORONAVIRUS 2 BY RT PCR (HOSPITAL ORDER, PERFORMED IN [facility]) - Abnormal; Notable for the following components:      Result Value   SARS Coronavirus 2 POSITIVE (*)    All other components within normal limits  BASIC METABOLIC PANEL - Abnormal; Notable for the following components:   Glucose,  Bld 140 (*)    All other components within normal limits  CBC WITH DIFFERENTIAL/PLATELET    EKG None  Radiology DG Ankle Complete Right  Result Date: 07/20/2020 CLINICAL DATA:  Skateboarding injury to the right ankle EXAM: RIGHT ANKLE - COMPLETE 3+ VIEW COMPARISON:  None. FINDINGS: There is a fracture of the distal fibula at the level of the syndesmosis (Weber type B) with dislocation of the tibiotalar joint and an open wound inferior to the medial malleolus. IMPRESSION: Fracture dislocation of the tibiotalar joint (Weber type B) with an open wound inferior to the medial malleolus. Electronically Signed   By: Romona Curls M.D.   On: 07/20/2020 12:55   DG Chest Port 1 View  Result Date: 07/20/2020 CLINICAL DATA:  COVID positive EXAM: PORTABLE CHEST 1 VIEW COMPARISON:  None. FINDINGS: The heart size and mediastinal contours are within normal limits.  Both lungs are clear. The visualized skeletal structures are unremarkable. IMPRESSION: No active disease. Electronically Signed   By: Romona Curls M.D.   On: 07/20/2020 15:08   DG Ankle Right Port  Result Date: 07/20/2020 CLINICAL DATA:  Ankle fracture status post splint placement. EXAM: PORTABLE RIGHT ANKLE - 2 VIEW COMPARISON:  Same day ankle radiographs FINDINGS: An overlying splint obscures bony detail. The known distal fibular fracture is in improved alignment. There is question of a posterior malleolar fracture on the lateral view. This was not seen on the prior radiograph. There is persistent widening of the medial ankle mortise. IMPRESSION: Improved alignment of the known distal fibular fracture. Question of a posterior malleolar fracture on the lateral view. Electronically Signed   By: Romona Curls M.D.   On: 07/20/2020 15:11   DG MINI C-ARM IMAGE ONLY  Result Date: 07/20/2020 There is no interpretation for this exam.  This order is for images obtained during a surgical procedure.  Please See "Surgeries" Tab for more information regarding the procedure.    Procedures Reduction of dislocation  Date/Time: 07/21/2020 10:16 AM Performed by: Bethel Born, PA-C Authorized by: Bethel Born, PA-C  Preparation: Patient was prepped and draped in the usual sterile fashion. Local anesthesia used: no  Anesthesia: Local anesthesia used: no  Sedation: Patient sedated: yes Sedation type: moderate (conscious) sedation Sedatives: etomidate and see MAR for details Analgesia: fentanyl and see MAR for details Sedation start date/time: 07/20/2020 2:15 PM Sedation end date/time: 07/20/2020 2:25 PM Vitals: Vital signs were monitored during sedation.  Patient tolerance: patient tolerated the procedure well with no immediate complications    (including critical care time)    CRITICAL CARE Performed by: Bethel Born  Total critical care time: 35 minutes  Critical care time  was exclusive of separately billable procedures and treating other patients.  Critical care was necessary to treat or prevent imminent or life-threatening deterioration.  Critical care was time spent personally by me on the following activities: development of treatment plan with patient and/or surrogate as well as nursing, discussions with consultants, evaluation of patient's response to treatment, examination of patient, obtaining history from patient or surrogate, ordering and performing treatments and interventions, ordering and review of laboratory studies, ordering and review of radiographic studies, pulse oximetry and re-evaluation of patient's condition.  Medications Ordered in ED Medications  0.9 %  sodium chloride infusion ( Intravenous New Bag/Given 07/20/20 2316)  celecoxib (CELEBREX) capsule 200 mg (200 mg Oral Given 07/21/20 0813)  HYDROcodone-acetaminophen (NORCO/VICODIN) 5-325 MG per tablet 1-2 tablet (has no administration in time range)  HYDROcodone-acetaminophen (NORCO)  7.5-325 MG per tablet 1-2 tablet (has no administration in time range)  morphine 2 MG/ML injection 0.5-1 mg (has no administration in time range)  docusate sodium (COLACE) capsule 100 mg (100 mg Oral Given 07/21/20 0814)  senna (SENOKOT) tablet 8.6 mg (8.6 mg Oral Given 07/21/20 0814)  polyethylene glycol (MIRALAX / GLYCOLAX) packet 17 g (has no administration in time range)  bisacodyl (DULCOLAX) suppository 10 mg (has no administration in time range)  magnesium citrate solution 1 Bottle (has no administration in time range)  ondansetron (ZOFRAN) tablet 4 mg (has no administration in time range)    Or  ondansetron (ZOFRAN) injection 4 mg (has no administration in time range)  enoxaparin (LOVENOX) injection 40 mg (40 mg Subcutaneous Given 07/21/20 0814)  ceFAZolin (ANCEF) IVPB 2g/100 mL premix (2 g Intravenous New Bag/Given 07/21/20 0546)    And  metroNIDAZOLE (FLAGYL) IVPB 500 mg (500 mg Intravenous New Bag/Given  07/21/20 0430)  ceFAZolin (ANCEF) IVPB 2g/100 mL premix (0 g Intravenous Stopped 07/20/20 1457)  Tdap (BOOSTRIX) injection 0.5 mL (0.5 mLs Intramuscular Given 07/20/20 1245)  etomidate (AMIDATE) injection (10 mg Intravenous Given 07/20/20 1417)  fentaNYL (SUBLIMAZE) injection (100 mcg Intravenous Given 07/20/20 1414)    ED Course  I have reviewed the triage vital signs and the nursing notes.  Pertinent labs & imaging results that were available during my care of the patient were reviewed by me and considered in my medical decision making (see chart for details).  49 year old male presents with an open fx of the right ankle after skateboarding accident. His vitals are normal. He is calm and comfortable, NAD. He has palpable pedal pulses. Will order xray, tdap, ancef, labs, COVID.  Xray shows Weber B fracture-dislocation. Will discuss with ortho  12:56 PM Discussed with Dr. Victorino Dike - he will take pt to the OR  2:15PM Sedation and reduction was done by myself and attending Dr. Deretha Emory. Post-reduction films show improved alignment.  3PM Pt to OR.   MDM Rules/Calculators/A&P                         Final Clinical Impression(s) / ED Diagnoses Final diagnoses:  Open fracture  Type I or II open fracture of right ankle, initial encounter  Dislocation of distal tibia, right, initial encounter    Rx / DC Orders ED Discharge Orders    None       Bethel Born, PA-C 07/21/20 1018    Vanetta Mulders, MD 07/28/20 [phone]

## 2020-07-20 NOTE — Anesthesia Procedure Notes (Signed)
Anesthesia Regional Block: Popliteal block   Pre-Anesthetic Checklist: ,, timeout performed, Correct Patient, Correct Site, Correct Laterality, Correct Procedure, Correct Position, site marked, Risks and benefits discussed,  Surgical consent,  Pre-op evaluation,  At surgeon's request and post-op pain management  Laterality: Right  Prep: chloraprep       Needles:  Injection technique: Single-shot  Needle Type: Echogenic Stimulator Needle     Needle Length: 9cm  Needle Gauge: 21     Additional Needles:   Procedures:,,,, ultrasound used (permanent image in chart),,,,  Narrative:  Start time: 07/20/2020 4:00 PM End time: 07/20/2020 4:05 PM Injection made incrementally with aspirations every 5 mL.  Performed by: Personally  Anesthesiologist: Shelton Silvas, MD  Additional Notes: Patient tolerated the procedure well. Local anesthetic introduced in an incremental fashion under minimal resistance after negative aspirations. No paresthesias were elicited. After completion of the procedure, no acute issues were identified and patient continued to be monitored by RN.

## 2020-07-20 NOTE — H&P (Signed)
Jason Hardy is an 49 y.o. male.   Chief Complaint: right ankle pain HPI:  49 y/o male without significant PMH fell while riding his son's skateboard this morning.  He noted immediate pain and a laceration with bone sticking out of the medial right ankle.  He came to the ER where xrays showed a fracture dislocation.  He last ate at 10:00 am but only liquids.  He denies any h/o injury or surgery to the right ankle.  He is not a smoker.  No h/o diabetes.  Covid test today is positive, though the patient has had both doses of vaccine over 2 months ago.  He denies any current flu symptoms.  He takes no medicines.  PMH:  None  PSH:  None  FH:  Father died of a heart attack many years ago.  Mom is 79 and has thyroid cancer.  Social History:  reports that he has never smoked. He does not have any smokeless tobacco history on file. He reports current alcohol use. He reports that he does not use drugs.  No smoking.  Works in [employer].  Allergies: No Known Allergies  (Not in a hospital admission)   Results for orders placed or performed during the hospital encounter of 07/20/20 (from the past 48 hour(s))  SARS Coronavirus 2 by RT PCR (hospital order, performed in [facility] hospital lab) Nasopharyngeal Nasopharyngeal Swab     Status: Abnormal   Collection Time: 07/20/20 11:43 AM   Specimen: Nasopharyngeal Swab  Result Value Ref Range   SARS Coronavirus 2 POSITIVE (A) NEGATIVE    Comment: RESULT CALLED TO, READ BACK BY AND VERIFIED WITH: RN C PRICE [id number] MLM (NOTE) SARS-CoV-2 target nucleic acids are DETECTED  SARS-CoV-2 RNA is generally detectable in upper respiratory specimens  during the acute phase of infection.  Positive results are indicative  of the presence of the identified virus, but do not rule out bacterial infection or co-infection with other pathogens not detected by the test.  Clinical correlation with patient history and  other diagnostic information is necessary to  determine patient infection status.  The expected result is negative.  Fact Sheet for Patients:   BoilerBrush.com.cy   Fact Sheet for Healthcare Providers:   https://pope.com/    This test is not yet approved or cleared by the [place] FDA and  has been authorized for detection and/or diagnosis of SARS-CoV-2 by FDA under an Emergency Use Authorization (EUA).  This EUA will remain in effect (meaning this test can b e used) for the duration of  the COVID-19 declaration under Section 564(b)(1) of the Act, 21 U.S.C. section 360-bbb-3(b)(1), unless the authorization is terminated or revoked sooner.  Performed at [facility], [address], [place], [place] [address]   Basic metabolic panel     Status: Abnormal   Collection Time: 07/20/20 11:43 AM  Result Value Ref Range   Sodium 140 135 - 145 mmol/L   Potassium 4.0 3.5 - 5.1 mmol/L   Chloride 106 98 - 111 mmol/L   CO2 24 22 - 32 mmol/L   Glucose, Bld 140 (H) 70 - 99 mg/dL    Comment: Glucose reference range applies only to samples taken after fasting for at least 8 hours.   BUN 10 6 - 20 mg/dL   Creatinine, Ser 7.03 0.61 - 1.24 mg/dL   Calcium 9.7 8.9 - 50.0 mg/dL   GFR calc non Af Amer >60 >60 mL/min   GFR calc Af Amer >60 >  60 mL/min   Anion gap 10 5 - 15    Comment: Performed at [facility], [address] [place], [place] [address]  CBC with Differential     Status: None   Collection Time: 07/20/20 11:43 AM  Result Value Ref Range   WBC 7.1 4.0 - 10.5 K/uL   RBC 4.49 4.22 - 5.81 MIL/uL   Hemoglobin 13.9 13.0 - 17.0 g/dL   HCT 57.3 39 - 52 %   MCV 93.5 80.0 - 100.0 fL   MCH 31.0 26.0 - 34.0 pg   MCHC 33.1 30.0 - 36.0 g/dL   RDW 22.0 25.4 - 27.0 %   Platelets 236 150 - 400 K/uL   nRBC 0.0 0.0 - 0.2 %   Neutrophils Relative % 63 %   Neutro Abs 4.4 1.7 - 7.7 K/uL   Lymphocytes Relative 23 %   Lymphs Abs 1.6 0.7 - 4.0 K/uL   Monocytes Relative 8 %    Monocytes Absolute 0.6 0 - 1 K/uL   Eosinophils Relative 5 %   Eosinophils Absolute 0.4 0 - 0 K/uL   Basophils Relative 1 %   Basophils Absolute 0.1 0 - 0 K/uL   Immature Granulocytes 0 %   Abs Immature Granulocytes 0.03 0.00 - 0.07 K/uL    Comment: Performed at [facility], [address], [place], [place] [address]   DG Ankle Complete Right  Result Date: 07/20/2020 CLINICAL DATA:  Skateboarding injury to the right ankle EXAM: RIGHT ANKLE - COMPLETE 3+ VIEW COMPARISON:  None. FINDINGS: There is a fracture of the distal fibula at the level of the syndesmosis (Weber type B) with dislocation of the tibiotalar joint and an open wound inferior to the medial malleolus. IMPRESSION: Fracture dislocation of the tibiotalar joint (Weber type B) with an open wound inferior to the medial malleolus. Electronically Signed   By: Romona Curls M.D.   On: 07/20/2020 12:55   DG Chest Port 1 View  Result Date: 07/20/2020 CLINICAL DATA:  COVID positive EXAM: PORTABLE CHEST 1 VIEW COMPARISON:  None. FINDINGS: The heart size and mediastinal contours are within normal limits. Both lungs are clear. The visualized skeletal structures are unremarkable. IMPRESSION: No active disease. Electronically Signed   By: Romona Curls M.D.   On: 07/20/2020 15:08   DG Ankle Right Port  Result Date: 07/20/2020 CLINICAL DATA:  Ankle fracture status post splint placement. EXAM: PORTABLE RIGHT ANKLE - 2 VIEW COMPARISON:  Same day ankle radiographs FINDINGS: An overlying splint obscures bony detail. The known distal fibular fracture is in improved alignment. There is question of a posterior malleolar fracture on the lateral view. This was not seen on the prior radiograph. There is persistent widening of the medial ankle mortise. IMPRESSION: Improved alignment of the known distal fibular fracture. Question of a posterior malleolar fracture on the lateral view. Electronically Signed   By: Romona Curls M.D.   On: 07/20/2020 15:11    DG MINI C-ARM IMAGE ONLY  Result Date: 07/20/2020 There is no interpretation for this exam.  This order is for images obtained during a surgical procedure.  Please See "Surgeries" Tab for more information regarding the procedure.    Review of Systems  No recent f/c/n/v/wt loss/ cough or other flu symptoms.  10 system review is otherwise negative.  Blood pressure 126/86, pulse 90, temperature 97.8 F (36.6 C), temperature source Oral, resp. rate 16, SpO2 98 %. Physical Exam  wn wd male in nad.  A and O x 4.  Normal mood and affect.  EOMI.  resp unlabored.  R ankle with medial laceration approx 8 cm long.  Medial malleolus is evident in the laceration.  No gross contamination.  Skin o/w healthy.  Intact sens to LT dorsally and plantarly at the forefoot.  No lymphadenopathy.  Active PF and DF of the toes.  Pulses are palpable in the foot.  Assessment/Plan Open right ankle fracture dislocation with disruption of the deltoid ligament - to the OR today for irrigation and excisional debridement of the open fracture, orif of the ankle fracture and repair of the deltoid ligament.  The risks and benefits of the alternative treatment options have been discussed in detail.  The patient wishes to proceed with surgery and specifically understands risks of bleeding, infection, nerve damage, blood clots, need for additional surgery, amputation and death.   Toni Arthurs, MD 04-Aug-2020, 3:39 PM

## 2020-07-21 ENCOUNTER — Encounter (HOSPITAL_COMMUNITY): Payer: Self-pay | Admitting: Orthopedic Surgery

## 2020-07-21 MED ORDER — ASPIRIN EC 81 MG PO TBEC: 81.0000 mg | DELAYED_RELEASE_TABLET | Freq: Two times a day (BID) | ORAL | 0 refills | Status: AC

## 2020-07-21 MED ORDER — DOCUSATE SODIUM 100 MG PO CAPS
100.0000 mg | ORAL_CAPSULE | Freq: Two times a day (BID) | ORAL | 0 refills | Status: AC
Start: 2020-07-21 — End: ?

## 2020-07-21 MED ORDER — SENNA 8.6 MG PO TABS: 2.0000 | ORAL_TABLET | Freq: Two times a day (BID) | ORAL | 0 refills | Status: AC

## 2020-07-21 MED ORDER — OXYCODONE HCL 5 MG PO TABS: 5.0000 mg | ORAL_TABLET | ORAL | 0 refills | Status: AC | PRN

## 2020-07-21 NOTE — TOC Transition Note (Signed)
Transition of Care [facility]) - CM/SW Discharge Note   Patient Details  Name: Jason Hardy MRN: [id number] Date of Birth: 1971/08/25  [facility] of [facility] CM/SW Contact:  Lockie Pares, RN Phone Number: 07/21/2020, 11:43 AM   Clinical Narrative:    Sherron Monday to otho office they will ask MD for script for knee scooter and fax over to adpt office on [address] . Talked to patient explained plan. They will run it through insurance to see if qualifies. Spent 30 minutes on phone with insurance but then got cut off.          Patient Goals and CMS Choice        Discharge Placement                       Discharge Plan and Services                                     Social Determinants of Health (SDOH) Interventions     Readmission Risk Interventions No flowsheet data found.

## 2020-07-21 NOTE — Evaluation (Signed)
Physical Therapy Evaluation Patient Details Name: Jason Hardy MRN: [id number] DOB: Jul 06, 1971 Today's Date: 07/21/2020   History of Present Illness  Pt adm 8/15 with open rt ankle fracture dislocation after falling off son's skateboard. Pt underwent ORIF on 8/15. Pt with +covid test. No symptoms. Vaccinated. PMH - none  Clinical Impression  Pt with decr mobility due to NWB of RLE after ankle fx. Did well and can return home with family. Will return to practice stairs later as pt asked to complete a phone call at this time.     Follow Up Recommendations No PT follow up    Equipment Recommendations  Crutches;Other (comment) (knee scooter)    Recommendations for Other Services       Precautions / Restrictions Precautions Precautions: None Restrictions Weight Bearing Restrictions: Yes RLE Weight Bearing: Non weight bearing      Mobility  Bed Mobility Overal bed mobility: Independent                Transfers Overall transfer level: Needs assistance Equipment used: Crutches (knee scooter) Transfers: Sit to/from Stand Sit to Stand: Supervision         General transfer comment: Verbal cues for technique  Ambulation/Gait Ambulation/Gait assistance: Supervision Gait Distance (Feet): 200 Feet (200' with knee scooter, 100' with crutches) Assistive device: Crutches (knee scooter) Gait Pattern/deviations: Step-through pattern (hop through)   Gait velocity interpretation: >2.62 ft/sec, indicative of community ambulatory General Gait Details: Assist for safety  Stairs            Wheelchair Mobility    Modified Rankin (Stroke Patients Only)       Balance Overall balance assessment: No apparent balance deficits (not formally assessed)                                           Pertinent Vitals/Pain Pain Assessment: No/denies pain    Home Living Family/patient expects to be discharged to:: Private residence Living Arrangements:  Spouse/significant other;Children Available Help at Discharge: Family;Available 24 hours/day Type of Home: House Home Access: Stairs to enter Entrance Stairs-Rails: Doctor, general practice of Steps: 2 Home Layout: Two level;Able to live on main level with bedroom/bathroom (office on 2nd level) Home Equipment: None      Prior Function Level of Independence: Independent               Hand Dominance        Extremity/Trunk Assessment   Upper Extremity Assessment Upper Extremity Assessment: Overall WFL for tasks assessed    Lower Extremity Assessment Lower Extremity Assessment: RLE deficits/detail RLE Deficits / Details: Short leg splint. NWB       Communication   Communication: No difficulties  Cognition Arousal/Alertness: Awake/alert Behavior During Therapy: WFL for tasks assessed/performed Overall Cognitive Status: Within Functional Limits for tasks assessed                                        General Comments      Exercises     Assessment/Plan    PT Assessment Patient needs continued PT services  PT Problem List Decreased mobility;Decreased knowledge of use of DME       PT Treatment Interventions Stair training;DME instruction;Gait training;Patient/family education    PT Goals (Current goals can be found in the Care Plan section)  Acute Rehab PT Goals Patient Stated Goal: return home PT Goal Formulation: With patient Time For Goal Achievement: 07/22/20 Potential to Achieve Goals: Good    Frequency Min 5X/week   Barriers to discharge Inaccessible home environment stairs to enter    Co-evaluation               AM-PAC PT "6 Clicks" Mobility  Outcome Measure Help needed turning from your back to your side while in a flat bed without using bedrails?: None Help needed moving from lying on your back to sitting on the side of a flat bed without using bedrails?: None Help needed moving to and from a bed to a chair  (including a wheelchair)?: None Help needed standing up from a chair using your arms (e.g., wheelchair or bedside chair)?: None Help needed to walk in hospital room?: None Help needed climbing 3-5 steps with a railing? : A Little 6 Click Score: 23    End of Session   Activity Tolerance: Patient tolerated treatment well Patient left: in bed;with call bell/phone within reach   PT Visit Diagnosis: Other abnormalities of gait and mobility (R26.89)    Time: 0930-1005 PT Time Calculation (min) (ACUTE ONLY): 35 min   Charges:   PT Evaluation $PT Eval Low Complexity: 1 Low PT Treatments $Gait Training: 8-22 mins        [facility] PT Acute Rehabilitation Services Pager [phone] Office [phone]   Angelina Ok [facility] 07/21/2020, 10:14 AM

## 2020-07-21 NOTE — Progress Notes (Signed)
Margrett Rud to be D/C'd home per MD order. Discussed with the patient and all questions fully answered. VVS, Skin clean, dry and intact without evidence of skin break down. ACE wrap to right foot clean and intact.  IV catheter discontinued intact. Site without signs and symptoms of complications. Dressing and pressure applied.  An After Visit Summary was printed and given to the patient. Currently waiting on crutches to be de livered to room and to hear from CM about his scooter.  Jon Gills

## 2020-07-21 NOTE — TOC Progression Note (Signed)
Transition of Care [facility]) - Progression Note    Patient Details  Name: Jason Hardy MRN: [id number] Date of Birth: 03-Jan-1971  Transition of Care [facility]) CM/SW Contact  Lockie Pares, RN Phone Number: 07/21/2020, 10:21 AM  Clinical Narrative:    Patient asking for a knee scooter. Called [employer]. He can obtain one at the store [phone] [address] [place]. Call them to see if they can deliver contactless as patent should not go to store with positive COVID test.         Expected Discharge Plan and Services    Home self care with crutches       Expected Discharge Date: 07/21/20                                     Social Determinants of Health (SDOH) Interventions    Readmission Risk Interventions No flowsheet data found.

## 2020-07-21 NOTE — Progress Notes (Signed)
Subjective: 1 Day Post-Op Procedure(s) (LRB): OPEN REDUCTION INTERNAL FIXATION (ORIF) ANKLE FRACTURE (Right)  Patient reports pain as mild to moderate.  Reports that he has a little nausea, but feels as though he is ready to eat.  Admits to flatus.  Denies fever, chills, V, CP, SOB. States that his toes are numb.  Objective:   VITALS:  Temp:  [97.8 F (36.6 C)-98.7 F (37.1 C)] 98.4 F (36.9 C) (08/16 0748) Pulse Rate:  [75-116] 87 (08/16 0402) Resp:  [10-24] 18 (08/16 0748) BP: (102-136)/(63-89) 103/63 (08/16 0748) SpO2:  [94 %-100 %] 95 % (08/16 0748) Weight:  [82.5 kg] 82.5 kg (08/15 2000)  General: WDWN patient in NAD. Psych:  Appropriate mood and affect. Neuro:  A&O x 3, Moving all extremities, sensation diminished to light touch HEENT:  EOMs intact Chest:  Even non-labored respirations Skin:  SLS C/D/I, no rashes or lesions Extremities: warm/dry, no visible edema, erythema or echymosis.  No lymphadenopathy. Pulses: Popliteus 2+ MSK:  ROM: EHL/FHL intact , MMT: able to perform quad set    LABS Recent Labs    07/20/20 1143  HGB 13.9  WBC 7.1  PLT 236   Recent Labs    07/20/20 1143  NA 140  K 4.0  CL 106  CO2 24  BUN 10  CREATININE 0.92  GLUCOSE 140*   No results for input(s): LABPT, INR in the last 72 hours.   Assessment/Plan: 1 Day Post-Op Procedure(s) (LRB): OPEN REDUCTION INTERNAL FIXATION (ORIF) ANKLE FRACTURE (Right)  NWB R LE Up with therapy D/C home today after 24 hours of IV ABX Plan for outpatient post-op visit with Dr. Victorino Dike DVT ppx: 81 mg ASA BID Scripts sent to patient's pharmacy.   Prior to prescribing the oxycodone I reviewed the patient's narcotic medical record in the PMP Aware database.  Alfredo Martinez PA-C [facility] Office:  [phone]

## 2020-07-21 NOTE — Plan of Care (Signed)

## 2020-07-21 NOTE — Progress Notes (Signed)
Orthopedic Tech Progress Note Patient Details:  Jason Hardy Aug 10, 1971 [id number]  Ortho Devices Type of Ortho Device: Crutches Ortho Device/Splint Location: LRE Ortho Device/Splint Interventions: Ordered, Application, Adjustment   Post Interventions Patient Tolerated: Ambulated well, Well Instructions Provided: Poper ambulation with device, Care of device   Jason Hardy 07/21/2020, 12:53 PM

## 2020-07-21 NOTE — Discharge Summary (Signed)
Physician Discharge Summary  Patient ID: Jason Hardy MRN: [id number] DOB/AGE: 03-14-1971 49 y.o.  Admit date: 07/20/2020 Discharge date: 07/21/2020  Admission Diagnoses: Open right ankle bimalleolar fx; Covid positive  Discharge Diagnoses:  Active Problems:   Ankle fracture   Bimalleolar ankle fracture, left, open type III, initial encounter Same as above  Discharged Condition: stable  Hospital Course: Patient presented to [facility] the ED via EMS on July 20, 2020 after falling off of a scooter while at home and injuring his right ankle.  Plain films were obtained.  He was diagnosed with an open right ankle bimalleolar fracture.  Dr. Jonny Ruiz heel was consulted.  The patient was taken to the OR for I&D and ORIF of right ankle bimalleolar fracture.  The patient tolerated the procedure well without any complications.  The patient was admitted to the hospital to the [facility] unit.  The patient worked well with therapy.  He tolerated his stay well without any complications.  He had 24 hours of IV Ancef following his surgery.  He is discharged home in stable condition on July 21, 2020.  Consults: PT/OT  Significant Diagnostic Studies: radiology: X-Ray: for diagosis, and to ensure satisfactory anatomic alignment during the operative procedure.  Treatments: IV hydration, antibiotics: Ancef, analgesia: Morphine, hydrocodone, tylenol, anticoagulation: lovenox and surgery: as stated above.  Discharge Exam: Blood pressure 103/63, pulse 87, temperature 98.4 F (36.9 C), temperature source Oral, resp. rate 18, height 5\' 8"  (1.727 m), weight 82.5 kg, SpO2 95 %. General: WDWN patient in NAD. Psych:  Appropriate mood and affect. Neuro:  A&O x 3, Moving all extremities, sensation subjectively diminished to light touch HEENT:  EOMs intact Chest:  Even non-labored respirations Skin:  SLS C/D/I, no rashes or lesions Extremities: warm/dry, no visible edema, erythema or echymosis.  No  lymphadenopathy. Pulses: Popliteus 2+ MSK:  ROM: EHL/FHL intact, MMT: able to perform quad set   Disposition: Discharge disposition: 01-Home or Self Care       Discharge Instructions    Call MD / Call 911   Complete by: As directed    If you experience chest pain or shortness of breath, CALL 911 and be transported to the hospital emergency room.  If you develope a fever above 101 F, pus (white drainage) or increased drainage or redness at the wound, or calf pain, call your surgeon's office.   Constipation Prevention   Complete by: As directed    Drink plenty of fluids.  Prune juice may be helpful.  You may use a stool softener, such as Colace (over the counter) 100 mg twice a day.  Use MiraLax (over the counter) for constipation as needed.   Diet - low sodium heart healthy   Complete by: As directed    Increase activity slowly as tolerated   Complete by: As directed    Non weight bearing   Complete by: As directed    Laterality: right   Extremity: Lower     Allergies as of 07/21/2020   No Known Allergies     Medication List    TAKE these medications   aspirin EC 81 MG tablet Take 1 tablet (81 mg total) by mouth 2 (two) times daily.   docusate sodium 100 MG capsule Commonly known as: Colace Take 1 capsule (100 mg total) by mouth 2 (two) times daily. While taking narcotic pain medicine.   oxyCODONE 5 MG immediate release tablet Commonly known as: Roxicodone Take 1 tablet (5 mg total) by mouth every 4 (four)  hours as needed for up to 5 days for moderate pain or severe pain.   senna 8.6 MG Tabs tablet Commonly known as: SENOKOT Take 2 tablets (17.2 mg total) by mouth 2 (two) times daily.            Discharge Care Instructions  (From admission, onward)         Start     Ordered   07/21/20 0000  Non weight bearing       Question Answer Comment  Laterality right   Extremity Lower      07/21/20 0818          Follow-up Information    Toni Arthurs, MD.  Schedule an appointment as soon as possible for a visit in 2 week(s).   Specialty: Orthopedic Surgery Contact information: [address] [place] [address] [phone]               Signed: Lolly Mustache Office:  [phone]

## 2020-07-21 NOTE — Progress Notes (Signed)
Physical Therapy Treatment Patient Details Name: Jason Hardy MRN: [id number] DOB: [birth year] Today's Date: 07/21/2020    History of Present Illness Pt adm 8/15 with open rt ankle fracture dislocation after falling off son's skateboard. Pt underwent ORIF on 8/15. Pt with +covid test. No symptoms. Vaccinated. PMH - none    PT Comments    Pt did well with stairs with crutches. Ready for dc from PT standpoint after equipment delivered.    Follow Up Recommendations  No PT follow up     Equipment Recommendations  Crutches;Other (comment) (knee scooter)    Recommendations for Other Services       Precautions / Restrictions Precautions Precautions: None Restrictions Weight Bearing Restrictions: Yes RLE Weight Bearing: Non weight bearing    Mobility  Bed Mobility Overal bed mobility: Independent                Transfers Overall transfer level: Modified independent Equipment used: Crutches Transfers: Sit to/from Stand Sit to Stand: Modified independent (Device/Increase time)         General transfer comment: Verbal cues for technique  Ambulation/Gait Ambulation/Gait assistance: Modified independent (Device/Increase time) Gait Distance (Feet): 20 Feet Assistive device: Crutches Gait Pattern/deviations: Step-through pattern (hop through)   Gait velocity interpretation: >2.62 ft/sec, indicative of community ambulatory General Gait Details: Steady gait with crutches   Stairs Stairs: Yes Stairs assistance: Supervision Stair Management: With crutches;Step to pattern Number of Stairs: 2 General stair comments: Good balance with gait   Wheelchair Mobility    Modified Rankin (Stroke Patients Only)       Balance Overall balance assessment: No apparent balance deficits (not formally assessed)                                          Cognition Arousal/Alertness: Awake/alert Behavior During Therapy: WFL for tasks assessed/performed Overall  Cognitive Status: Within Functional Limits for tasks assessed                                        Exercises      General Comments        Pertinent Vitals/Pain Pain Assessment: No/denies pain    Home Living Family/patient expects to be discharged to:: Private residence Living Arrangements: Spouse/significant other;Children Available Help at Discharge: Family;Available 24 hours/day Type of Home: House Home Access: Stairs to enter Entrance Stairs-Rails: Right;Left Home Layout: Two level;Able to live on main level with bedroom/bathroom (office on 2nd level) Home Equipment: None      Prior Function Level of Independence: Independent          PT Goals (current goals can now be found in the care plan section) Acute Rehab PT Goals Patient Stated Goal: return home PT Goal Formulation: With patient Time For Goal Achievement: 07/22/20 Potential to Achieve Goals: Good Progress towards PT goals: Goals met/education completed, patient discharged from PT    Frequency    Min 5X/week      PT Plan      Co-evaluation              AM-PAC PT "6 Clicks" Mobility   Outcome Measure  Help needed turning from your back to your side while in a flat bed without using bedrails?: None Help needed moving from lying on your back to sitting on the  side of a flat bed without using bedrails?: None Help needed moving to and from a bed to a chair (including a wheelchair)?: None Help needed standing up from a chair using your arms (e.g., wheelchair or bedside chair)?: None Help needed to walk in hospital room?: None Help needed climbing 3-5 steps with a railing? : None 6 Click Score: 24    End of Session   Activity Tolerance: Patient tolerated treatment well Patient left: in bed;with call bell/phone within reach Nurse Communication: Mobility status PT Visit Diagnosis: Other abnormalities of gait and mobility (R26.89)     Time: 2395-3202 PT Time Calculation  (min) (ACUTE ONLY): 8 min  Charges:  $Gait Training: 8-22 mins                     [place] Pager [phone] Office Walker 07/21/2020, 10:52 AM

## 2021-08-17 IMAGING — DX DG CHEST 1V PORT
1 series · 1 of 1 positions shown · non-contrast
Comparison: None.

CLINICAL DATA: COVID positive

EXAM:
PORTABLE CHEST 1 VIEW

[chest]
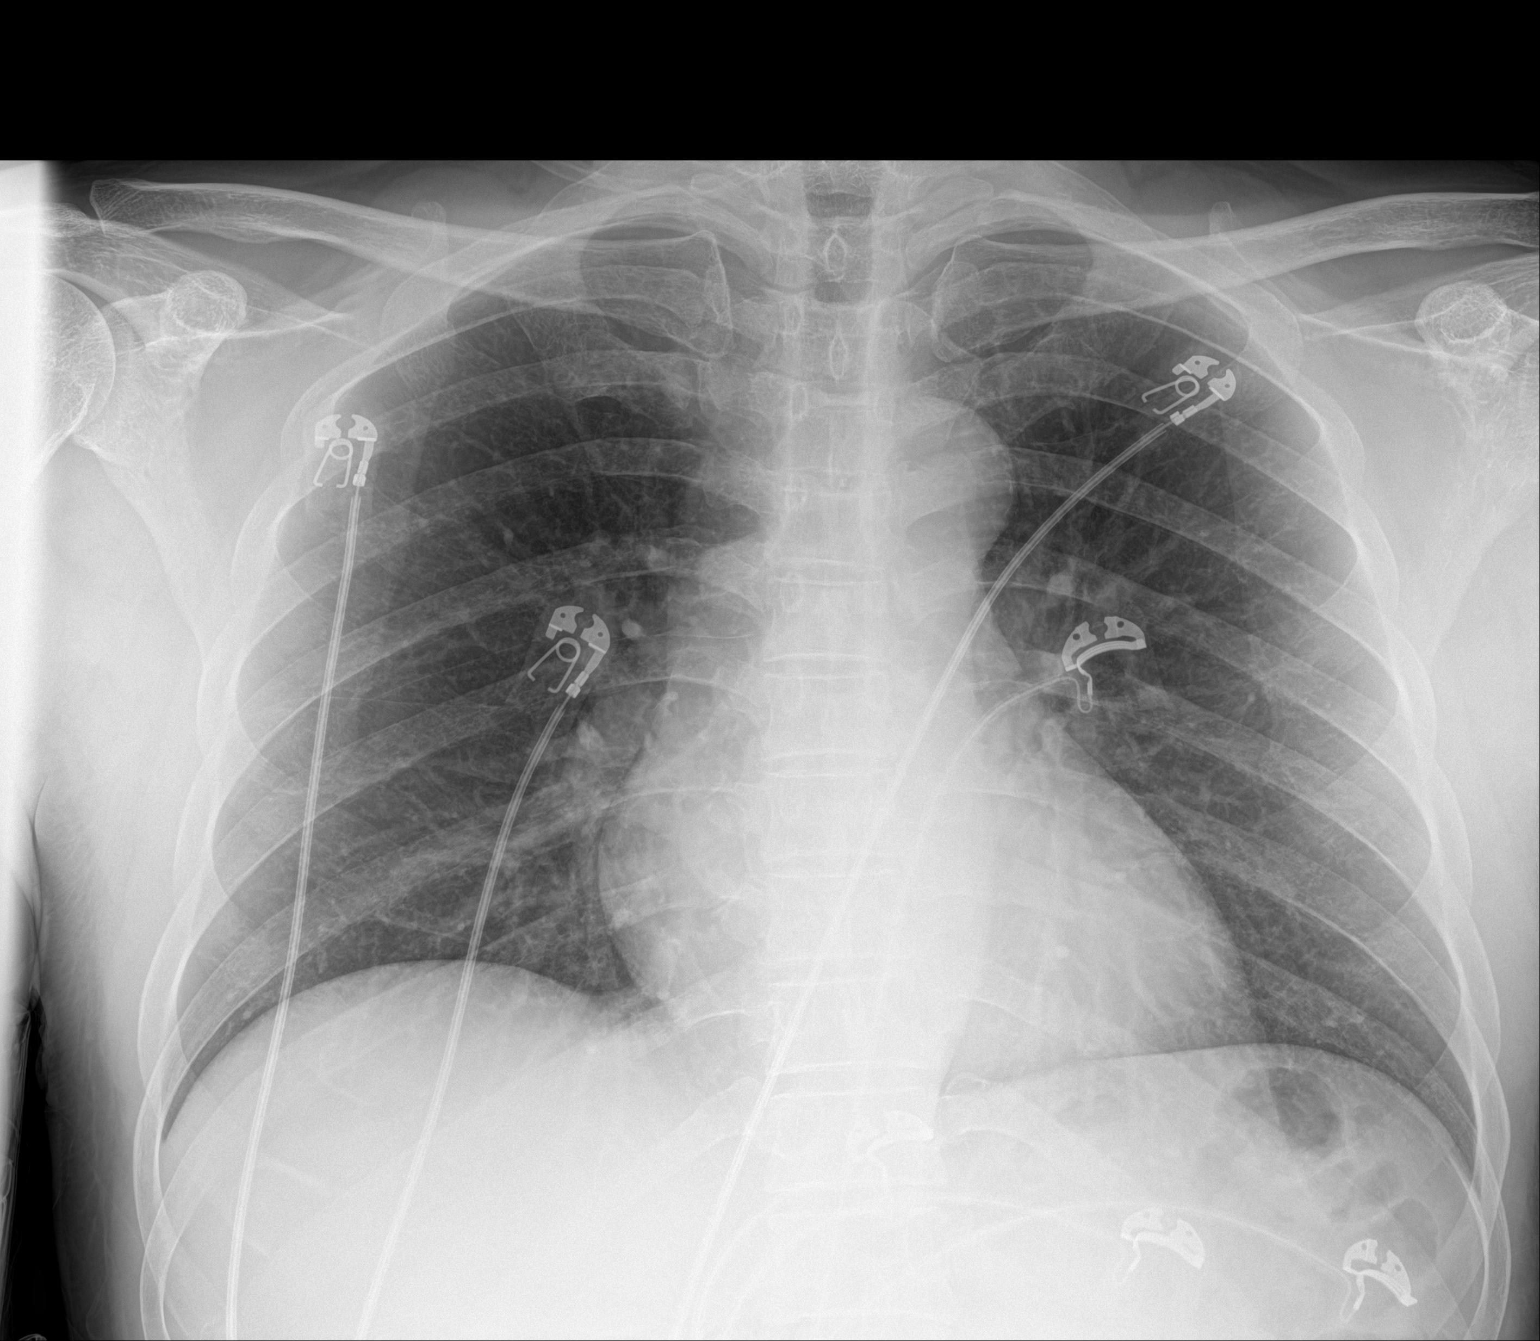

[1 of 1 positions shown; findings below may reference images not displayed]

FINDINGS: The heart size and mediastinal contours are within normal limits.
Both lungs are clear. The visualized skeletal structures are
unremarkable.
IMPRESSION: No active disease.

## 2021-08-17 IMAGING — DX DG ANKLE PORT 2V*R*
1 series · 2 of 2 positions shown · non-contrast
Comparison: Same day ankle radiographs

CLINICAL DATA: Ankle fracture status post splint placement.

EXAM:
PORTABLE RIGHT ANKLE - 2 VIEW

[Series 1: ankle · 0.14mm/px · 2 of 2 slices shown]
[im 1/2]
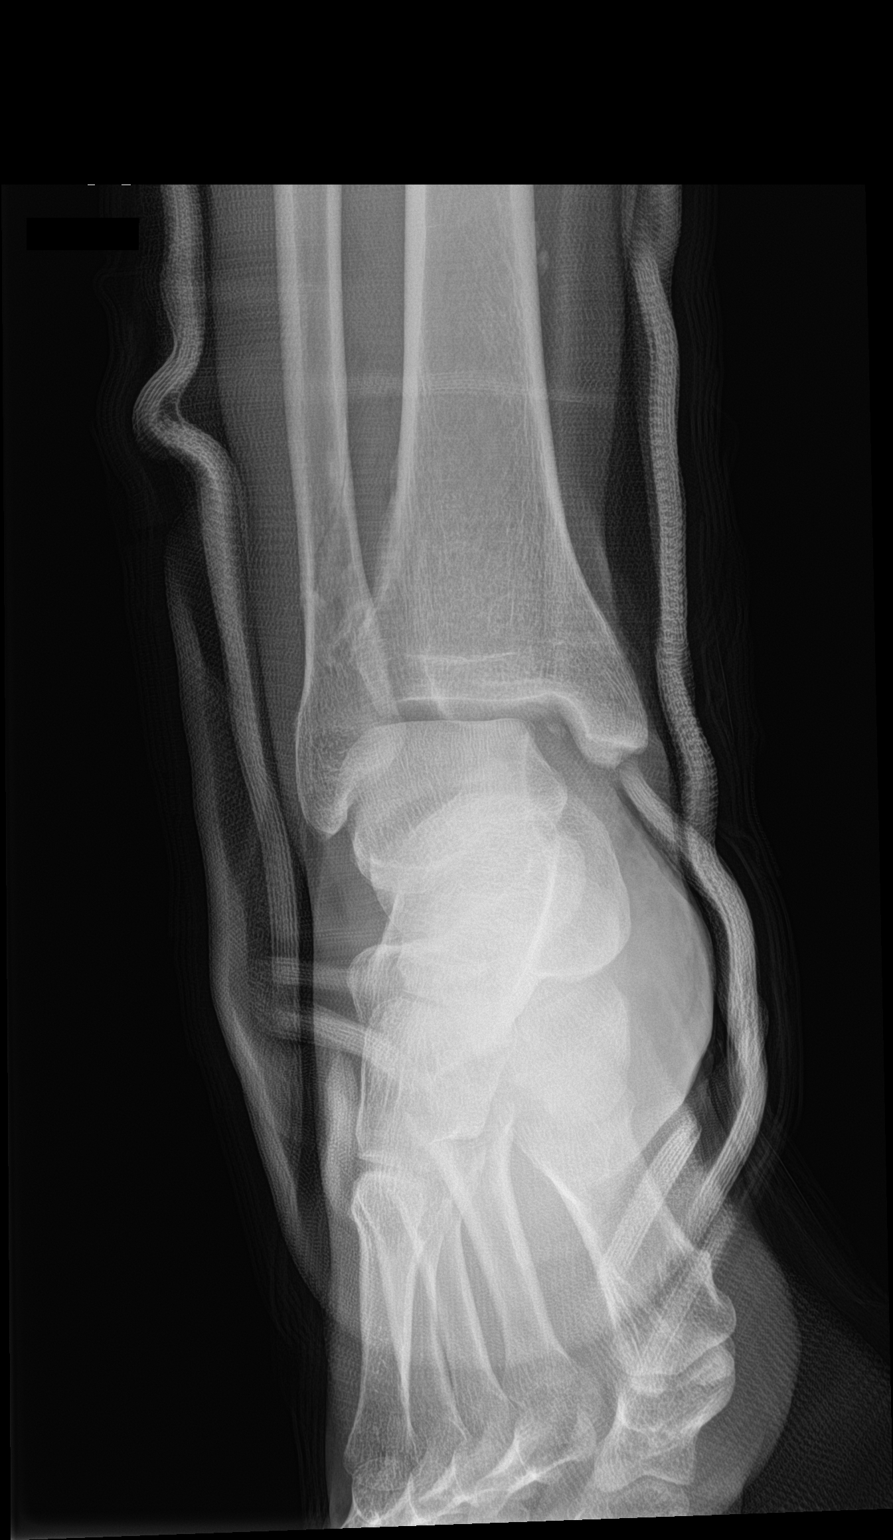
[im 2/2]
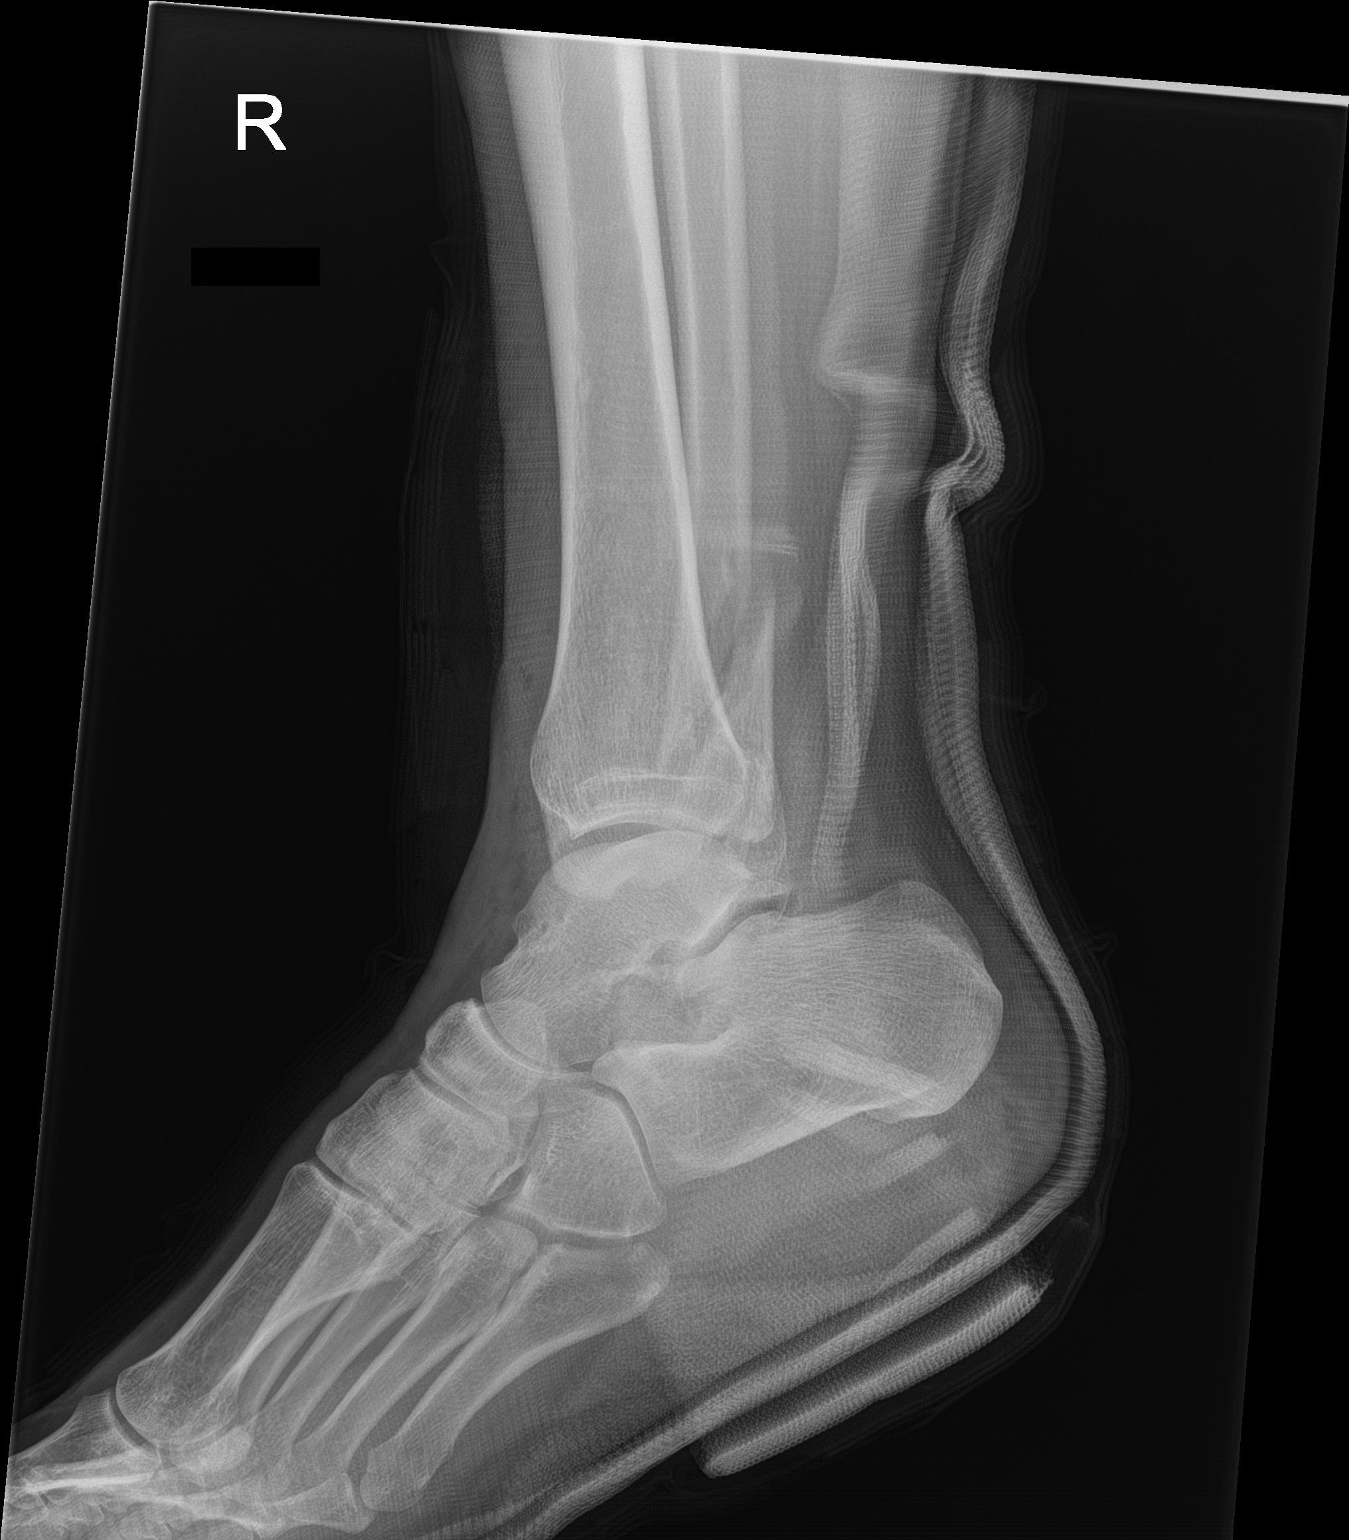

[2 of 2 positions shown; findings below may reference images not displayed]

FINDINGS: An overlying splint obscures bony detail. The known distal fibular
fracture is in improved alignment. There is question of a posterior
malleolar fracture on the lateral view. This was not seen on the
prior radiograph. There is persistent widening of the medial ankle
mortise.
IMPRESSION: Improved alignment of the known distal fibular fracture. Question of
a posterior malleolar fracture on the lateral view.
